# Patient Record
Sex: Male | Born: 1970 | Smoking: Former smoker
Health system: Southern US, Community
[De-identification: ages and names within clinical notes are randomized; demographics above are authoritative.]

## PROBLEM LIST (undated history)

## (undated) DIAGNOSIS — K635 Polyp of colon: Secondary | ICD-10-CM

## (undated) DIAGNOSIS — D860 Sarcoidosis of lung: Secondary | ICD-10-CM

## (undated) DIAGNOSIS — F419 Anxiety disorder, unspecified: Secondary | ICD-10-CM

## (undated) DIAGNOSIS — G4730 Sleep apnea, unspecified: Secondary | ICD-10-CM

## (undated) DIAGNOSIS — F1020 Alcohol dependence, uncomplicated: Secondary | ICD-10-CM

## (undated) DIAGNOSIS — J189 Pneumonia, unspecified organism: Secondary | ICD-10-CM

## (undated) DIAGNOSIS — D869 Sarcoidosis, unspecified: Secondary | ICD-10-CM

## (undated) DIAGNOSIS — J449 Chronic obstructive pulmonary disease, unspecified: Secondary | ICD-10-CM

## (undated) DIAGNOSIS — N200 Calculus of kidney: Secondary | ICD-10-CM

## (undated) DIAGNOSIS — E119 Type 2 diabetes mellitus without complications: Secondary | ICD-10-CM

## (undated) DIAGNOSIS — I4891 Unspecified atrial fibrillation: Secondary | ICD-10-CM

## (undated) DIAGNOSIS — J45909 Unspecified asthma, uncomplicated: Secondary | ICD-10-CM

## (undated) HISTORY — DX: Chronic obstructive pulmonary disease, unspecified: J44.9

## (undated) HISTORY — DX: Sarcoidosis, unspecified: D86.9

## (undated) HISTORY — DX: Calculus of kidney: N20.0

## (undated) HISTORY — PX: VASECTOMY: SHX75

## (undated) HISTORY — DX: Alcohol dependence, uncomplicated: F10.20

## (undated) HISTORY — DX: Anxiety disorder, unspecified: F41.9

## (undated) HISTORY — DX: Sleep apnea, unspecified: G47.30

## (undated) HISTORY — DX: Polyp of colon: K63.5

## (undated) HISTORY — PX: LUNG BIOPSY: SHX232

## (undated) HISTORY — DX: Pneumonia, unspecified organism: J18.9

---

## 2019-05-19 ENCOUNTER — Ambulatory Visit (INDEPENDENT_AMBULATORY_CARE_PROVIDER_SITE_OTHER): Payer: Non-veteran care | Admitting: Psychology

## 2019-05-19 DIAGNOSIS — F4310 Post-traumatic stress disorder, unspecified: Secondary | ICD-10-CM

## 2019-06-09 ENCOUNTER — Ambulatory Visit (INDEPENDENT_AMBULATORY_CARE_PROVIDER_SITE_OTHER): Payer: No Typology Code available for payment source | Admitting: Psychology

## 2019-06-09 DIAGNOSIS — F4310 Post-traumatic stress disorder, unspecified: Secondary | ICD-10-CM

## 2019-06-25 ENCOUNTER — Ambulatory Visit (INDEPENDENT_AMBULATORY_CARE_PROVIDER_SITE_OTHER): Payer: No Typology Code available for payment source | Admitting: Psychology

## 2019-06-25 DIAGNOSIS — F4310 Post-traumatic stress disorder, unspecified: Secondary | ICD-10-CM

## 2019-07-10 ENCOUNTER — Ambulatory Visit: Payer: Non-veteran care | Admitting: Psychology

## 2019-07-21 ENCOUNTER — Ambulatory Visit (INDEPENDENT_AMBULATORY_CARE_PROVIDER_SITE_OTHER): Payer: No Typology Code available for payment source | Admitting: Psychology

## 2019-07-21 DIAGNOSIS — F4310 Post-traumatic stress disorder, unspecified: Secondary | ICD-10-CM

## 2019-08-25 ENCOUNTER — Ambulatory Visit (INDEPENDENT_AMBULATORY_CARE_PROVIDER_SITE_OTHER): Payer: No Typology Code available for payment source | Admitting: Psychology

## 2019-08-25 DIAGNOSIS — F4310 Post-traumatic stress disorder, unspecified: Secondary | ICD-10-CM

## 2019-09-22 ENCOUNTER — Ambulatory Visit (INDEPENDENT_AMBULATORY_CARE_PROVIDER_SITE_OTHER): Payer: No Typology Code available for payment source | Admitting: Psychology

## 2019-09-22 DIAGNOSIS — F4310 Post-traumatic stress disorder, unspecified: Secondary | ICD-10-CM

## 2019-10-27 ENCOUNTER — Ambulatory Visit (INDEPENDENT_AMBULATORY_CARE_PROVIDER_SITE_OTHER): Payer: No Typology Code available for payment source | Admitting: Psychology

## 2019-10-27 DIAGNOSIS — F4310 Post-traumatic stress disorder, unspecified: Secondary | ICD-10-CM

## 2019-11-24 ENCOUNTER — Ambulatory Visit: Payer: No Typology Code available for payment source | Admitting: Psychology

## 2019-12-03 ENCOUNTER — Ambulatory Visit (INDEPENDENT_AMBULATORY_CARE_PROVIDER_SITE_OTHER): Payer: No Typology Code available for payment source | Admitting: Psychology

## 2019-12-03 DIAGNOSIS — F4310 Post-traumatic stress disorder, unspecified: Secondary | ICD-10-CM

## 2020-01-12 ENCOUNTER — Ambulatory Visit: Payer: No Typology Code available for payment source | Admitting: Psychology

## 2020-01-14 ENCOUNTER — Ambulatory Visit (INDEPENDENT_AMBULATORY_CARE_PROVIDER_SITE_OTHER): Payer: No Typology Code available for payment source | Admitting: Psychology

## 2020-01-14 DIAGNOSIS — F4310 Post-traumatic stress disorder, unspecified: Secondary | ICD-10-CM | POA: Diagnosis not present

## 2020-02-28 ENCOUNTER — Ambulatory Visit: Payer: Self-pay | Attending: Internal Medicine

## 2020-02-28 DIAGNOSIS — Z23 Encounter for immunization: Secondary | ICD-10-CM

## 2020-02-28 NOTE — Progress Notes (Signed)
   Covid-19 Vaccination Clinic  Name:  Jacob Gilmore    MRN: [id number] DOB: 1971/04/20  02/28/2020  [name] was observed post Covid-19 immunization for 15 minutes without incident. He was provided with Vaccine Information Sheet and instruction to access the V-Safe system.   [name] was instructed to call 911 with any severe reactions post vaccine: Marland Kitchen Difficulty breathing  . Swelling of face and throat  . A fast heartbeat  . A bad rash all over body  . Dizziness and weakness   Immunizations Administered    Name Date Dose VIS Date Route   Pfizer COVID-19 Vaccine 02/28/2020 10:30 AM 0.3 mL 12/10/2018 Intramuscular   Manufacturer: [employer], [employer]   Lot: [id number]   NDC: [address]-7

## 2020-03-18 ENCOUNTER — Ambulatory Visit (INDEPENDENT_AMBULATORY_CARE_PROVIDER_SITE_OTHER): Payer: No Typology Code available for payment source | Admitting: Psychology

## 2020-03-18 DIAGNOSIS — F4310 Post-traumatic stress disorder, unspecified: Secondary | ICD-10-CM | POA: Diagnosis not present

## 2020-04-26 ENCOUNTER — Ambulatory Visit: Payer: No Typology Code available for payment source | Admitting: Psychology

## 2020-10-07 ENCOUNTER — Encounter (HOSPITAL_COMMUNITY): Payer: Self-pay | Admitting: *Deleted

## 2020-10-07 NOTE — Progress Notes (Signed)
Received [facility] Author[facility] - [id number] referral from Dr. 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 Clinical review of pt follow up appt on 11/15 Pulmonary office note.  Pt with Covid Risk Score - 1. Pt appropriate for scheduling for Pulmonary rehab.  Will forward to support staff for scheduling and verification of insurance eligibility/benefits with pt consent. Alanson Aly, BSN Cardiac and Emergency planning/management officer

## 2020-10-11 ENCOUNTER — Telehealth (HOSPITAL_COMMUNITY): Payer: Self-pay | Admitting: General Practice

## 2020-11-02 ENCOUNTER — Telehealth (HOSPITAL_COMMUNITY): Payer: Self-pay | Admitting: *Deleted

## 2020-11-18 ENCOUNTER — Telehealth (HOSPITAL_COMMUNITY): Payer: Self-pay | Admitting: *Deleted

## 2020-11-22 ENCOUNTER — Other Ambulatory Visit: Payer: Self-pay

## 2020-11-22 ENCOUNTER — Encounter (HOSPITAL_COMMUNITY)
Admission: RE | Admit: 2020-11-22 | Discharge: 2020-11-22 | Disposition: A | Discharge status: Home / Self Care | Payer: No Typology Code available for payment source | Source: Ambulatory Visit | Attending: Cardiology | Admitting: Cardiology

## 2020-11-22 ENCOUNTER — Encounter (HOSPITAL_COMMUNITY): Payer: Self-pay

## 2020-11-22 VITALS — BP 104/60 | HR 106 | Ht 74.0 in | Wt 203.7 lb

## 2020-11-22 DIAGNOSIS — J439 Emphysema, unspecified: Secondary | ICD-10-CM | POA: Insufficient documentation

## 2020-11-22 DIAGNOSIS — Z87891 Personal history of nicotine dependence: Secondary | ICD-10-CM | POA: Insufficient documentation

## 2020-11-22 HISTORY — DX: Unspecified atrial fibrillation: I48.91

## 2020-11-22 HISTORY — DX: Sarcoidosis of lung: D86.0

## 2020-11-22 HISTORY — DX: Unspecified asthma, uncomplicated: J45.909

## 2020-11-22 HISTORY — DX: Type 2 diabetes mellitus without complications: E11.9

## 2020-11-22 NOTE — Progress Notes (Deleted)
Pulmonary Individual Treatment Plan  Patient Details  Name: Jacob Gilmore MRN: [id number] Date of Birth: 01/01/1971 Referring Provider:   Flowsheet Row Pulmonary Rehab Walk Test from 11/22/2020 in [facility]  [facility] Provider [facility]      Initial Encounter Date:  Flowsheet Row Pulmonary Rehab Walk Test from 11/22/2020 in [facility]  Date 11/22/20      Visit Diagnosis: Pulmonary emphysema, unspecified emphysema type (HCC)  Patient's Home Medications on Admission:   Current Outpatient Medications:  .  ARTIFICIAL SALIVA MT, TAKE 4 SPRAYS BY MOUTH AT BEDTIME AS NEEDED, Disp: , Rfl:  .  insulin aspart protamine - aspart (NOVOLOG MIX 70/30 FLEXPEN) (70-30) 100 UNIT/ML FlexPen, INJECT 30 UNITS SUBCUTANEOUSLY TWICE A DAY BEFORE MEALS, Disp: , Rfl:  .  mometasone (ASMANEX) 220 MCG/INH inhaler, INHALE 2 PUFFS BY MOUTH AT BEDTIME (RINSE MOUTH WELL WITH WATER AFTER EACH USE), Disp: , Rfl:   Past Medical History: Past Medical History:  Diagnosis Date  . Asthma   . Atrial fibrillation (HCC)   . Diabetes mellitus without complication (HCC)   . Pulmonary sarcoidosis (HCC)     Tobacco Use: Social History   Tobacco Use  Smoking Status Not on file  Smokeless Tobacco Not on file    Labs: Recent Review Flowsheet Data   There is no flowsheet data to display.     Capillary Blood Glucose: No results found for: GLUCAP   Pulmonary Assessment Scores:  Pulmonary Assessment Scores    Row Name 11/22/20 1001 11/22/20 1056       ADL UCSD   ADL Phase Entry --    SOB Score total 46 --         CAT Score   CAT Score 30 --         mMRC Score   mMRC Score -- 4          UCSD: Self-administered rating of dyspnea associated with activities of daily living (ADLs) 6-point scale (0 = "not at all" to 5 = "maximal or unable to do because of breathlessness")  Scoring Scores range from 0 to 120.  Minimally important difference is 5  units  CAT: CAT can identify the health impairment of COPD patients and is better correlated with disease progression.  CAT has a scoring range of zero to 40. The CAT score is classified into four groups of low (less than 10), medium (10 - 20), high (21-30) and very high (31-40) based on the impact level of disease on health status. A CAT score over 10 suggests significant symptoms.  A worsening CAT score could be explained by an exacerbation, poor medication adherence, poor inhaler technique, or progression of COPD or comorbid conditions.  CAT MCID is 2 points  mMRC: mMRC (Modified Medical Research Council) Dyspnea Scale is used to assess the degree of baseline functional disability in patients of respiratory disease due to dyspnea. No minimal important difference is established. A decrease in score of 1 point or greater is considered a positive change.   Pulmonary Function Assessment:  Pulmonary Function Assessment - 11/22/20 0958      Breath   Shortness of Breath Yes;Fear of Shortness of Breath;Limiting activity;Panic with Shortness of Breath           Exercise Target Goals: Exercise Program Goal: Individual exercise prescription set using results from initial 6 min walk test and THRR while considering  patient's activity barriers and safety.   Exercise Prescription Goal: Initial   exercise prescription builds to 30-45 minutes a day of aerobic activity, 2-3 days per week.  Home exercise guidelines will be given to patient during program as part of exercise prescription that the participant will acknowledge.  Activity Barriers & Risk Stratification:  Activity Barriers & Cardiac Risk Stratification - 11/22/20 0955      Activity Barriers & Cardiac Risk Stratification   Activity Barriers Deconditioning;Muscular Weakness;Shortness of Breath           6 Minute Walk:  6 Minute Walk    Row Name 11/22/20 1041         6 Minute Walk   Phase Initial     Distance 1016 feet     Walk  Time 6 minutes     # of Rest Breaks 0     MPH 1.92     METS 3.63     RPE 13     Perceived Dyspnea  2     VO2 Peak 12.69     Symptoms No     Resting HR 93 bpm     Resting BP 110/86     Resting Oxygen Saturation  98 %     Exercise Oxygen Saturation  during 6 min walk 86 %     Max Ex. HR 101 bpm     Max Ex. BP 120/90     2 Minute Post BP 108/88           Interval HR   1 Minute HR 101     2 Minute HR 93     3 Minute HR 91     4 Minute HR 94     5 Minute HR 95     6 Minute HR 100     2 Minute Post HR 90     Interval Heart Rate? Yes           Interval Oxygen   Interval Oxygen? Yes     Baseline Oxygen Saturation % 98 %     1 Minute Oxygen Saturation % 95 %     1 Minute Liters of Oxygen 0 L     2 Minute Oxygen Saturation % 95 %     2 Minute Liters of Oxygen 0 L     3 Minute Oxygen Saturation % 95 %     3 Minute Liters of Oxygen 0 L     4 Minute Oxygen Saturation % 93 %     4 Minute Liters of Oxygen 0 L     5 Minute Oxygen Saturation % 90 %     5 Minute Liters of Oxygen 0 L     6 Minute Oxygen Saturation % 86 %     6 Minute Liters of Oxygen 0 L     2 Minute Post Oxygen Saturation % 92 %     2 Minute Post Liters of Oxygen 0 L            Oxygen Initial Assessment:  Oxygen Initial Assessment - 11/22/20 1039      Home Oxygen   Home Oxygen Device None    Sleep Oxygen Prescription None    Home Exercise Oxygen Prescription None    Home Resting Oxygen Prescription None      Initial 6 min Walk   Oxygen Used None      Program Oxygen Prescription   Program Oxygen Prescription None      Intervention   Short Term Goals To learn and understand importance of monitoring   SPO2 with pulse oximeter and demonstrate accurate use of the pulse oximeter.;To learn and understand importance of maintaining oxygen saturations>88%;To learn and demonstrate proper pursed lip breathing techniques or other breathing techniques.;To learn and demonstrate proper use of respiratory medications     Long  Term Goals Verbalizes importance of monitoring SPO2 with pulse oximeter and return demonstration;Maintenance of O2 saturations>88%;Exhibits proper breathing techniques, such as pursed lip breathing or other method taught during program session;Compliance with respiratory medication;Demonstrates proper use of MDI's           Oxygen Re-Evaluation:   Oxygen Discharge (Final Oxygen Re-Evaluation):   Initial Exercise Prescription:  Initial Exercise Prescription - 11/22/20 1000      Date of Initial Exercise RX and Referring Provider   Date 11/22/20    [facility]    Expected Discharge Date 01/27/21      Treadmill   MPH 1.8    Grade 0    Minutes 15      NuStep   Level 2    SPM 80    Minutes 15      Prescription Details   Frequency (times per week) 2    Duration Progress to 45 minutes of aerobic exercise without signs/symptoms of physical distress      Intensity   THRR 40-80% of Max Heartrate 68-136    Ratings of Perceived Exertion 11-13    Perceived Dyspnea 0-4      Progression   Progression Continue to progress workloads to maintain intensity without signs/symptoms of physical distress.      Resistance Training   Training Prescription Yes    Weight blue bands    Reps 10-15           Perform Capillary Blood Glucose checks as needed.  Exercise Prescription Changes:   Exercise Comments:   Exercise Goals and Review:  Exercise Goals    Row Name 11/22/20 1037             Exercise Goals   Increase Physical Activity Yes       Intervention Provide advice, education, support and counseling about physical activity/exercise needs.;Develop an individualized exercise prescription for aerobic and resistive training based on initial evaluation findings, risk stratification, comorbidities and participant's personal goals.       Expected Outcomes Short Term: Attend rehab on a regular basis to increase amount of physical activity.;Long Term: Add in home  exercise to make exercise part of routine and to increase amount of physical activity.;Long Term: Exercising regularly at least 3-5 days a week.       Increase Strength and Stamina Yes       Intervention Provide advice, education, support and counseling about physical activity/exercise needs.;Develop an individualized exercise prescription for aerobic and resistive training based on initial evaluation findings, risk stratification, comorbidities and participant's personal goals.       Expected Outcomes Short Term: Increase workloads from initial exercise prescription for resistance, speed, and METs.;Short Term: Perform resistance training exercises routinely during rehab and add in resistance training at home;Long Term: Improve cardiorespiratory fitness, muscular endurance and strength as measured by increased METs and functional capacity (6MWT)       Able to understand and use rate of perceived exertion (RPE) scale Yes       Intervention Provide education and explanation on how to use RPE scale       Expected Outcomes Short Term: Able to use RPE daily in rehab to express subjective intensity level;Long Term:  Able to use   RPE to guide intensity level when exercising independently       Able to understand and use Dyspnea scale Yes       Intervention Provide education and explanation on how to use Dyspnea scale       Expected Outcomes Short Term: Able to use Dyspnea scale daily in rehab to express subjective sense of shortness of breath during exertion;Long Term: Able to use Dyspnea scale to guide intensity level when exercising independently       Knowledge and understanding of Target Heart Rate Range (THRR) Yes       Intervention Provide education and explanation of THRR including how the numbers were predicted and where they are located for reference       Expected Outcomes Short Term: Able to state/look up THRR;Long Term: Able to use THRR to govern intensity when exercising independently;Short Term:  Able to use daily as guideline for intensity in rehab       Understanding of Exercise Prescription Yes       Intervention Provide education, explanation, and written materials on patient's individual exercise prescription       Expected Outcomes Short Term: Able to explain program exercise prescription;Long Term: Able to explain home exercise prescription to exercise independently              Exercise Goals Re-Evaluation :   Discharge Exercise Prescription (Final Exercise Prescription Changes):   Nutrition:  Target Goals: Understanding of nutrition guidelines, daily intake of sodium <1500mg, cholesterol <200mg, calories 30% from fat and 7% or less from saturated fats, daily to have 5 or more servings of fruits and vegetables.  Biometrics:  Pre Biometrics - 11/22/20 0956      Pre Biometrics   Height 6' 2" (1.88 m)    Weight 92.4 kg    BMI (Calculated) 26.14    Grip Strength 45.5 kg            Nutrition Therapy Plan and Nutrition Goals:   Nutrition Assessments:  MEDIFICTS Score Key:  ?70 Need to make dietary changes   40-70 Heart Healthy Diet  ? 40 Therapeutic Level Cholesterol Diet   Picture Your Plate Scores:  <40 Unhealthy dietary pattern with much room for improvement.  41-50 Dietary pattern unlikely to meet recommendations for good health and room for improvement.  51-60 More healthful dietary pattern, with some room for improvement.   >60 Healthy dietary pattern, although there may be some specific behaviors that could be improved.    Nutrition Goals Re-Evaluation:   Nutrition Goals Discharge (Final Nutrition Goals Re-Evaluation):   Psychosocial: Target Goals: Acknowledge presence or absence of significant depression and/or stress, maximize coping skills, provide positive support system. Participant is able to verbalize types and ability to use techniques and skills needed for reducing stress and depression.  Initial Review & Psychosocial  Screening:  Initial Psych Review & Screening - 11/22/20 1001      Initial Review   Current issues with None Identified      Family Dynamics   Good Support System? Yes      Barriers   Psychosocial barriers to participate in program There are no identifiable barriers or psychosocial needs.      Screening Interventions   Interventions Encouraged to exercise           Quality of Life Scores:  Scores of 19 and below usually indicate a poorer quality of life in these areas.  A difference of  2-3 points is a clinically meaningful difference.    A difference of 2-3 points in the total score of the Quality of Life Index has been associated with significant improvement in overall quality of life, self-image, physical symptoms, and general health in studies assessing change in quality of life.  PHQ-9: Recent Review Flowsheet Data    Depression screen PHQ 2/9 11/22/2020   Decreased Interest 0   Down, Depressed, Hopeless 0   PHQ - 2 Score 0   Altered sleeping 0   Tired, decreased energy 2   Change in appetite 0   Feeling bad or failure about yourself  0   Trouble concentrating 0   Moving slowly or fidgety/restless 0   Suicidal thoughts 0   Difficult doing work/chores Not difficult at all     Interpretation of Total Score  Total Score Depression Severity:  1-4 = Minimal depression, 5-9 = Mild depression, 10-14 = Moderate depression, 15-19 = Moderately severe depression, 20-27 = Severe depression   Psychosocial Evaluation and Intervention:  Psychosocial Evaluation - 11/22/20 1002      Psychosocial Evaluation & Interventions   Interventions Encouraged to exercise with the program and follow exercise prescription    Comments No barriers identified    Continue Psychosocial Services  No Follow up required           Psychosocial Re-Evaluation:   Psychosocial Discharge (Final Psychosocial Re-Evaluation):   Education: Education Goals: Education classes will be provided on a weekly  basis, covering required topics. Participant will state understanding/return demonstration of topics presented.  Learning Barriers/Preferences:  Learning Barriers/Preferences - 11/22/20 1002      Learning Barriers/Preferences   Learning Barriers None    Learning Preferences Computer/Internet           Education Topics: Risk Factor Reduction:  -Group instruction that is supported by a PowerPoint presentation. Instructor discusses the definition of a risk factor, different risk factors for pulmonary disease, and how the heart and lungs work together.     Nutrition for Pulmonary Patient:  -Group instruction provided by PowerPoint slides, verbal discussion, and written materials to support subject matter. The instructor gives an explanation and review of healthy diet recommendations, which includes a discussion on weight management, recommendations for fruit and vegetable consumption, as well as protein, fluid, caffeine, fiber, sodium, sugar, and alcohol. Tips for eating when patients are short of breath are discussed.   Pursed Lip Breathing:  -Group instruction that is supported by demonstration and informational handouts. Instructor discusses the benefits of pursed lip and diaphragmatic breathing and detailed demonstration on how to preform both.     Oxygen Safety:  -Group instruction provided by PowerPoint, verbal discussion, and written material to support subject matter. There is an overview of "What is Oxygen" and "Why do we need it".  Instructor also reviews how to create a safe environment for oxygen use, the importance of using oxygen as prescribed, and the risks of noncompliance. There is a brief discussion on traveling with oxygen and resources the patient may utilize.   Oxygen Equipment:  -Group instruction provided by Home Health Staff utilizing handouts, written materials, and equipment demonstrations.   Signs and Symptoms:  -Group instruction provided by written material  and verbal discussion to support subject matter. Warning signs and symptoms of infection, stroke, and heart attack are reviewed and when to call the physician/911 reinforced. Tips for preventing the spread of infection discussed.   Advanced Directives:  -Group instruction provided by verbal instruction and written material to support subject matter. Instructor reviews Advanced Directive laws and   proper instruction for filling out document.   Pulmonary Video:  -Group video education that reviews the importance of medication and oxygen compliance, exercise, good nutrition, pulmonary hygiene, and pursed lip and diaphragmatic breathing for the pulmonary patient.   Exercise for the Pulmonary Patient:  -Group instruction that is supported by a PowerPoint presentation. Instructor discusses benefits of exercise, core components of exercise, frequency, duration, and intensity of an exercise routine, importance of utilizing pulse oximetry during exercise, safety while exercising, and options of places to exercise outside of rehab.     Pulmonary Medications:  -Verbally interactive group education provided by instructor with focus on inhaled medications and proper administration.   Anatomy and Physiology of the Respiratory System and Intimacy:  -Group instruction provided by PowerPoint, verbal discussion, and written material to support subject matter. Instructor reviews respiratory cycle and anatomical components of the respiratory system and their functions. Instructor also reviews differences in obstructive and restrictive respiratory diseases with examples of each. Intimacy, Sex, and Sexuality differences are reviewed with a discussion on how relationships can change when diagnosed with pulmonary disease. Common sexual concerns are reviewed.   MD DAY -A group question and answer session with a medical doctor that allows participants to ask questions that relate to their pulmonary disease  state.   OTHER EDUCATION -Group or individual verbal, written, or video instructions that support the educational goals of the pulmonary rehab program.   Holiday Eating Survival Tips:  -Group instruction provided by PowerPoint slides, verbal discussion, and written materials to support subject matter. The instructor gives patients tips, tricks, and techniques to help them not only survive but enjoy the holidays despite the onslaught of food that accompanies the holidays.   Knowledge Questionnaire Score:  Knowledge Questionnaire Score - 11/22/20 1019      Knowledge Questionnaire Score   Pre Score 14/18           Core Components/Risk Factors/Patient Goals at Admission:  Personal Goals and Risk Factors at Admission - 11/22/20 1003      Core Components/Risk Factors/Patient Goals on Admission   Improve shortness of breath with ADL's Yes    Intervention Provide education, individualized exercise plan and daily activity instruction to help decrease symptoms of SOB with activities of daily living.    Expected Outcomes Short Term: Improve cardiorespiratory fitness to achieve a reduction of symptoms when performing ADLs;Long Term: Be able to perform more ADLs without symptoms or delay the onset of symptoms           Core Components/Risk Factors/Patient Goals Review:   Goals and Risk Factor Review    Row Name 11/22/20 1003             Core Components/Risk Factors/Patient Goals Review   Personal Goals Review Develop more efficient breathing techniques such as purse lipped breathing and diaphragmatic breathing and practicing self-pacing with activity.;Increase knowledge of respiratory medications and ability to use respiratory devices properly.;Improve shortness of breath with ADL's              Core Components/Risk Factors/Patient Goals at Discharge (Final Review):   Goals and Risk Factor Review - 11/22/20 1003      Core Components/Risk Factors/Patient Goals Review   Personal  Goals Review Develop more efficient breathing techniques such as purse lipped breathing and diaphragmatic breathing and practicing self-pacing with activity.;Increase knowledge of respiratory medications and ability to use respiratory devices properly.;Improve shortness of breath with ADL's           ITP Comments:     Comments:  

## 2020-11-22 NOTE — Progress Notes (Signed)
Jacob Gilmore 50 y.o. male Pulmonary Rehab Orientation Note This patient who was referred to Pulmonary rehab by Dr. Shelle Iron from the [facility] in [place] with the diagnosis of emphysema, and he also has pulmonary sarcoidosis arrived today in Cardiac and Pulmonary Rehab. He arrived ambulatory with normal gait. He does not carry portable oxygen and does not use oxygen at all.  Color good, skin warm and dry. Patient is oriented to time and place. Patient's medical history, psychosocial health, and medications reviewed. Psychosocial assessment reveals pt lives alone. 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 Pt reports his stress level is low. Pt does not exhibit signs of depression. PHQ2/9 score 0/2. Pt shows good  coping skills with positive outlook .   Will continue to monitor and evaluate progress toward psychosocial goal(s) of continued mental well being while participating in pulmonary rehab. Physical assessment reveals heart rate is tachycardic, breath sounds clear to auscultation, no wheezes, rales, or rhonchi. Grip strength equal, strong. Patient reports he does take medications as prescribed. Patient states he follows a Diabetic diet. The patient reports no specific efforts to gain or lose weight.. Patient's weight will be monitored closely. Demonstration and practice of PLB using pulse oximeter. Patient able to return demonstration satisfactorily. Safety and hand hygiene in the exercise area reviewed with patient. Patient voices understanding of the information reviewed. Department expectations discussed with patient and achievable goals were set. The patient shows enthusiasm about attending the program and we look forward to working with this nice gentleman. The patient completed a 6 min walk test today 11/22/2020 and to begin exercise on Tuesday, 11/22/2020 in the 1: 30 pm exercise slot.  0900-1100

## 2020-11-22 NOTE — Progress Notes (Signed)
Pulmonary Individual Treatment Plan  Patient Details  Name: Jacob Gilmore MRN: [id number] Date of Birth: 18-Oct-1970 Referring Provider:   Doristine Devoid Pulmonary Rehab Walk Test from 11/22/2020 in [facility]  [facility]      Initial Encounter Date:  Flowsheet Row Pulmonary Rehab Walk Test from 11/22/2020 in [facility] CARDI[facility] REHAB  Date 11/22/20      Visit Diagnosis: Pulmonary emphysema, unspecified emphysema type (HCC)  Patient's Home Medications on Admission:   Current Outpatient Medications:  .  ARTIFICIAL SALIVA MT, TAKE 4 SPRAYS BY MOUTH AT BEDTIME AS NEEDED, Disp: , Rfl:  .  insulin aspart protamine - aspart (NOVOLOG MIX 70/30 FLEXPEN) (70-30) 100 UNIT/ML FlexPen, INJECT 30 UNITS SUBCUTANEOUSLY TWICE A DAY BEFORE MEALS, Disp: , Rfl:  .  mometasone (ASMANEX) 220 MCG/INH inhaler, INHALE 2 PUFFS BY MOUTH AT BEDTIME (RINSE MOUTH WELL WITH WATER AFTER EACH USE), Disp: , Rfl:   Past Medical History: Past Medical History:  Diagnosis Date  . Asthma   . Atrial fibrillation (HCC)   . Diabetes mellitus without complication (HCC)   . Pulmonary sarcoidosis (HCC)     Tobacco Use: Social History   Tobacco Use  Smoking Status Not on file  Smokeless Tobacco Not on file    Labs: Recent Review Flowsheet Data   There is no flowsheet data to display.     Capillary Blood Glucose: No results found for: GLUCAP   Pulmonary Assessment Scores:  Pulmonary Assessment Scores    Row Name 11/22/20 1001 11/22/20 1056       ADL UCSD   ADL Phase Entry --    SOB Score total 46 --         CAT Score   CAT Score 30 --         mMRC Score   mMRC Score -- 4          UCSD: Self-administered rating of dyspnea associated with activities of daily living (ADLs) 6-point scale (0 = "not at all" to 5 = "maximal or unable to do because of breathlessness")  Scoring Scores range from 0 to 120.  Minimally important difference is 5  units  CAT: CAT can identify the health impairment of COPD patients and is better correlated with disease progression.  CAT has a scoring range of zero to 40. The CAT score is classified into four groups of low (less than 10), medium (10 - 20), high (21-30) and very high (31-40) based on the impact level of disease on health status. A CAT score over 10 suggests significant symptoms.  A worsening CAT score could be explained by an exacerbation, poor medication adherence, poor inhaler technique, or progression of COPD or comorbid conditions.  CAT MCID is 2 points  mMRC: mMRC (Modified Medical Research Council) Dyspnea Scale is used to assess the degree of baseline functional disability in patients of respiratory disease due to dyspnea. No minimal important difference is established. A decrease in score of 1 point or greater is considered a positive change.   Pulmonary Function Assessment:  Pulmonary Function Assessment - 11/22/20 0958      Breath   Shortness of Breath Yes;Fear of Shortness of Breath;Limiting activity;Panic with Shortness of Breath           Exercise Target Goals: Exercise Program Goal: Individual exercise prescription set using results from initial 6 min walk test and THRR while considering  patient's activity barriers and safety.   Exercise Prescription Goal: Initial  exercise prescription builds to 30-45 minutes a day of aerobic activity, 2-3 days per week.  Home exercise guidelines will be given to patient during program as part of exercise prescription that the participant will acknowledge.  Activity Barriers & Risk Stratification:  Activity Barriers & Cardiac Risk Stratification - 11/22/20 0955      Activity Barriers & Cardiac Risk Stratification   Activity Barriers Deconditioning;Muscular Weakness;Shortness of Breath           6 Minute Walk:  6 Minute Walk    Row Name 11/22/20 1041         6 Minute Walk   Phase Initial     Distance 1016 feet     Walk  Time 6 minutes     # of Rest Breaks 0     MPH 1.92     METS 3.63     RPE 13     Perceived Dyspnea  2     VO2 Peak 12.69     Symptoms No     Resting HR 93 bpm     Resting BP 110/86     Resting Oxygen Saturation  98 %     Exercise Oxygen Saturation  during 6 min walk 86 %     Max Ex. HR 101 bpm     Max Ex. BP 120/90     2 Minute Post BP 108/88           Interval HR   1 Minute HR 101     2 Minute HR 93     3 Minute HR 91     4 Minute HR 94     5 Minute HR 95     6 Minute HR 100     2 Minute Post HR 90     Interval Heart Rate? Yes           Interval Oxygen   Interval Oxygen? Yes     Baseline Oxygen Saturation % 98 %     1 Minute Oxygen Saturation % 95 %     1 Minute Liters of Oxygen 0 L     2 Minute Oxygen Saturation % 95 %     2 Minute Liters of Oxygen 0 L     3 Minute Oxygen Saturation % 95 %     3 Minute Liters of Oxygen 0 L     4 Minute Oxygen Saturation % 93 %     4 Minute Liters of Oxygen 0 L     5 Minute Oxygen Saturation % 90 %     5 Minute Liters of Oxygen 0 L     6 Minute Oxygen Saturation % 86 %     6 Minute Liters of Oxygen 0 L     2 Minute Post Oxygen Saturation % 92 %     2 Minute Post Liters of Oxygen 0 L            Oxygen Initial Assessment:  Oxygen Initial Assessment - 11/22/20 1039      Home Oxygen   Home Oxygen Device None    Sleep Oxygen Prescription None    Home Exercise Oxygen Prescription None    Home Resting Oxygen Prescription None      Initial 6 min Walk   Oxygen Used None      Program Oxygen Prescription   Program Oxygen Prescription None      Intervention   Short Term Goals To learn and understand importance of monitoring  SPO2 with pulse oximeter and demonstrate accurate use of the pulse oximeter.;To learn and understand importance of maintaining oxygen saturations>88%;To learn and demonstrate proper pursed lip breathing techniques or other breathing techniques.;To learn and demonstrate proper use of respiratory medications     Long  Term Goals Verbalizes importance of monitoring SPO2 with pulse oximeter and return demonstration;Maintenance of O2 saturations>88%;Exhibits proper breathing techniques, such as pursed lip breathing or other method taught during program session;Compliance with respiratory medication;Demonstrates proper use of MDI's           Oxygen Re-Evaluation:   Oxygen Discharge (Final Oxygen Re-Evaluation):   Initial Exercise Prescription:  Initial Exercise Prescription - 11/22/20 1000      Date of Initial Exercise RX and Referring Provider   Date 11/22/20    [facility]    Expected Discharge Date 01/27/21      Treadmill   MPH 1.8    Grade 0    Minutes 15      NuStep   Level 2    SPM 80    Minutes 15      Prescription Details   Frequency (times per week) 2    Duration Progress to 45 minutes of aerobic exercise without signs/symptoms of physical distress      Intensity   THRR 40-80% of Max Heartrate 68-136    Ratings of Perceived Exertion 11-13    Perceived Dyspnea 0-4      Progression   Progression Continue to progress workloads to maintain intensity without signs/symptoms of physical distress.      Resistance Training   Training Prescription Yes    Weight blue bands    Reps 10-15           Perform Capillary Blood Glucose checks as needed.  Exercise Prescription Changes:   Exercise Comments:   Exercise Goals and Review:  Exercise Goals    Row Name 11/22/20 1037             Exercise Goals   Increase Physical Activity Yes       Intervention Provide advice, education, support and counseling about physical activity/exercise needs.;Develop an individualized exercise prescription for aerobic and resistive training based on initial evaluation findings, risk stratification, comorbidities and participant's personal goals.       Expected Outcomes Short Term: Attend rehab on a regular basis to increase amount of physical activity.;Long Term: Add in home  exercise to make exercise part of routine and to increase amount of physical activity.;Long Term: Exercising regularly at least 3-5 days a week.       Increase Strength and Stamina Yes       Intervention Provide advice, education, support and counseling about physical activity/exercise needs.;Develop an individualized exercise prescription for aerobic and resistive training based on initial evaluation findings, risk stratification, comorbidities and participant's personal goals.       Expected Outcomes Short Term: Increase workloads from initial exercise prescription for resistance, speed, and METs.;Short Term: Perform resistance training exercises routinely during rehab and add in resistance training at home;Long Term: Improve cardiorespiratory fitness, muscular endurance and strength as measured by increased METs and functional capacity ( )       Able to understand and use rate of perceived exertion (RPE) scale Yes       Intervention Provide education and explanation on how to use RPE scale       Expected Outcomes Short Term: Able to use RPE daily in rehab to express subjective intensity level;Long Term:  Able to use  RPE to guide intensity level when exercising independently       Able to understand and use Dyspnea scale Yes       Intervention Provide education and explanation on how to use Dyspnea scale       Expected Outcomes Short Term: Able to use Dyspnea scale daily in rehab to express subjective sense of shortness of breath during exertion;Long Term: Able to use Dyspnea scale to guide intensity level when exercising independently       Knowledge and understanding of Target Heart Rate Range (THRR) Yes       Intervention Provide education and explanation of THRR including how the numbers were predicted and where they are located for reference       Expected Outcomes Short Term: Able to state/look up THRR;Long Term: Able to use THRR to govern intensity when exercising independently;Short Term:  Able to use daily as guideline for intensity in rehab       Understanding of Exercise Prescription Yes       Intervention Provide education, explanation, and written materials on patient's individual exercise prescription       Expected Outcomes Short Term: Able to explain program exercise prescription;Long Term: Able to explain home exercise prescription to exercise independently              Exercise Goals Re-Evaluation :   Discharge Exercise Prescription (Final Exercise Prescription Changes):   Nutrition:  Target Goals: Understanding of nutrition guidelines, daily intake of sodium 1500mg , cholesterol 200mg , calories 30% from fat and 7% or less from saturated fats, daily to have 5 or more servings of fruits and vegetables.  Biometrics:  Pre Biometrics - 11/22/20 0956      Pre Biometrics   Height 6\' 2"  (1.88 m)    Weight 92.4 kg    BMI (Calculated) 26.14    Grip Strength 45.5 kg            Nutrition Therapy Plan and Nutrition Goals:   Nutrition Assessments:  MEDIFICTS Score Key:  ?70 Need to make dietary changes   40-70 Heart Healthy Diet  ? 40 Therapeutic Level Cholesterol Diet   Picture Your Plate Scores:  Unhealthy dietary pattern with much room for improvement.  41-50 Dietary pattern unlikely to meet recommendations for good health and room for improvement.  51-60 More healthful dietary pattern, with some room for improvement.   >60 Healthy dietary pattern, although there may be some specific behaviors that could be improved.    Nutrition Goals Re-Evaluation:   Nutrition Goals Discharge (Final Nutrition Goals Re-Evaluation):   Psychosocial: Target Goals: Acknowledge presence or absence of significant depression and/or stress, maximize coping skills, provide positive support system. Participant is able to verbalize types and ability to use techniques and skills needed for reducing stress and depression.  Initial Review & Psychosocial  Screening:  Initial Psych Review & Screening - 11/22/20 1001      Initial Review   Current issues with None Identified      Family Dynamics   Good Support System? Yes      Barriers   Psychosocial barriers to participate in program There are no identifiable barriers or psychosocial needs.      Screening Interventions   Interventions Encouraged to exercise           Quality of Life Scores:  Scores of 19 and below usually indicate a poorer quality of life in these areas.  A difference of  2-3 points is a clinically meaningful difference.  A difference of 2-3 points in the total score of the Quality of Life Index has been associated with significant improvement in overall quality of life, self-image, physical symptoms, and general health in studies assessing change in quality of life.  PHQ-9: Recent Review Flowsheet Data    Depression screen [facility] 2/9 11/22/2020   Decreased Interest 0   Down, Depressed, Hopeless 0   PHQ - 2 Score 0   Altered sleeping 0   Tired, decreased energy 2   Change in appetite 0   Feeling bad or failure about yourself  0   Trouble concentrating 0   Moving slowly or fidgety/restless 0   Suicidal thoughts 0   Difficult doing work/chores Not difficult at all     Interpretation of Total Score  Total Score Depression Severity:  1-4 = Minimal depression, 5-9 = Mild depression, 10-14 = Moderate depression, 15-19 = Moderately severe depression, 20-27 = Severe depression   Psychosocial Evaluation and Intervention:  Psychosocial Evaluation - 11/22/20 1002      Psychosocial Evaluation & Interventions   Interventions Encouraged to exercise with the program and follow exercise prescription    Comments No barriers identified    Continue Psychosocial Services  No Follow up required           Psychosocial Re-Evaluation:   Psychosocial Discharge (Final Psychosocial Re-Evaluation):   Education: Education Goals: Education classes will be provided on a weekly  basis, covering required topics. Participant will state understanding/return demonstration of topics presented.  Learning Barriers/Preferences:  Learning Barriers/Preferences - 11/22/20 1002      Learning Barriers/Preferences   Learning Barriers None    Learning Preferences Computer/Internet           Education Topics: Risk Factor Reduction:  -Group instruction that is supported by a PowerPoint presentation. Instructor discusses the definition of a risk factor, different risk factors for pulmonary disease, and how the heart and lungs work together.     Nutrition for Pulmonary Patient:  -Group instruction provided by PowerPoint slides, verbal discussion, and written materials to support subject matter. The instructor gives an explanation and review of healthy diet recommendations, which includes a discussion on weight management, recommendations for fruit and vegetable consumption, as well as protein, fluid, caffeine, fiber, sodium, sugar, and alcohol. Tips for eating when patients are short of breath are discussed.   Pursed Lip Breathing:  -Group instruction that is supported by demonstration and informational handouts. Instructor discusses the benefits of pursed lip and diaphragmatic breathing and detailed demonstration on how to preform both.     Oxygen Safety:  -Group instruction provided by PowerPoint, verbal discussion, and written material to support subject matter. There is an overview of "What is Oxygen" and "Why do we need it".  Instructor also reviews how to create a safe environment for oxygen use, the importance of using oxygen as prescribed, and the risks of noncompliance. There is a brief discussion on traveling with oxygen and resources the patient may utilize.   Oxygen Equipment:  -Group instruction provided by [facility] Staff utilizing handouts, written materials, and equipment demonstrations.   Signs and Symptoms:  -Group instruction provided by written material  and verbal discussion to support subject matter. Warning signs and symptoms of infection, stroke, and heart attack are reviewed and when to call the physician/911 reinforced. Tips for preventing the spread of infection discussed.   Advanced Directives:  -Group instruction provided by verbal instruction and written material to support subject matter. Instructor reviews Energy manager and  proper instruction for filling out document.   Pulmonary Video:  -Group video education that reviews the importance of medication and oxygen compliance, exercise, good nutrition, pulmonary hygiene, and pursed lip and diaphragmatic breathing for the pulmonary patient.   Exercise for the Pulmonary Patient:  -Group instruction that is supported by a PowerPoint presentation. Instructor discusses benefits of exercise, core components of exercise, frequency, duration, and intensity of an exercise routine, importance of utilizing pulse oximetry during exercise, safety while exercising, and options of places to exercise outside of rehab.     Pulmonary Medications:  -Verbally interactive group education provided by instructor with focus on inhaled medications and proper administration.   Anatomy and Physiology of the Respiratory System and Intimacy:  -Group instruction provided by PowerPoint, verbal discussion, and written material to support subject matter. Instructor reviews respiratory cycle and anatomical components of the respiratory system and their functions. Instructor also reviews differences in obstructive and restrictive respiratory diseases with examples of each. Intimacy, Sex, and Sexuality differences are reviewed with a discussion on how relationships can change when diagnosed with pulmonary disease. Common sexual concerns are reviewed.   MD DAY -A group question and answer session with a medical doctor that allows participants to ask questions that relate to their pulmonary disease  state.   OTHER EDUCATION -Group or individual verbal, written, or video instructions that support the educational goals of the pulmonary rehab program.   Holiday Eating Survival Tips:  -Group instruction provided by PowerPoint slides, verbal discussion, and written materials to support subject matter. The instructor gives patients tips, tricks, and techniques to help them not only survive but enjoy the holidays despite the onslaught of food that accompanies the holidays.   Knowledge Questionnaire Score:  Knowledge Questionnaire Score - 11/22/20 1019      Knowledge Questionnaire Score   Pre Score 14/18           Core Components/Risk Factors/Patient Goals at Admission:  Personal Goals and Risk Factors at Admission - 11/22/20 1003      Core Components/Risk Factors/Patient Goals on Admission   Improve shortness of breath with ADL's Yes    Intervention Provide education, individualized exercise plan and daily activity instruction to help decrease symptoms of SOB with activities of daily living.    Expected Outcomes Short Term: Improve cardiorespiratory fitness to achieve a reduction of symptoms when performing ADLs;Long Term: Be able to perform more ADLs without symptoms or delay the onset of symptoms           Core Components/Risk Factors/Patient Goals Review:   Goals and Risk Factor Review    Row Name 11/22/20 1003             Core Components/Risk Factors/Patient Goals Review   Personal Goals Review Develop more efficient breathing techniques such as purse lipped breathing and diaphragmatic breathing and practicing self-pacing with activity.;Increase knowledge of respiratory medications and ability to use respiratory devices properly.;Improve shortness of breath with ADL's              Core Components/Risk Factors/Patient Goals at Discharge (Final Review):   Goals and Risk Factor Review - 11/22/20 1003      Core Components/Risk Factors/Patient Goals Review   Personal  Goals Review Develop more efficient breathing techniques such as purse lipped breathing and diaphragmatic breathing and practicing self-pacing with activity.;Increase knowledge of respiratory medications and ability to use respiratory devices properly.;Improve shortness of breath with ADL's           ITP Comments:  Comments:

## 2020-11-30 ENCOUNTER — Other Ambulatory Visit: Payer: Self-pay

## 2020-11-30 ENCOUNTER — Encounter (HOSPITAL_COMMUNITY)
Admission: RE | Admit: 2020-11-30 | Discharge: 2020-11-30 | Disposition: A | Discharge status: Home / Self Care | Payer: No Typology Code available for payment source | Source: Ambulatory Visit | Attending: Cardiology | Admitting: Cardiology

## 2020-11-30 DIAGNOSIS — J439 Emphysema, unspecified: Secondary | ICD-10-CM | POA: Diagnosis not present

## 2020-11-30 LAB — GLUCOSE, CAPILLARY: Glucose-Capillary: 186 mg/dL — ABNORMAL HIGH (ref 70–99)

## 2020-11-30 NOTE — Progress Notes (Signed)
Daily Session Note  Patient Details  Name: Jacob Gilmore MRN: [id number] Date of Birth: [birth year] Referring Provider:   April Manson Pulmonary Rehab Walk Test from 11/22/2020 in [place]  [facility]      Encounter Date: 11/30/2020  Check In:  Session Check In - 11/30/20 1436      Check-In   Supervising physician immediately available to respond to emergencies [facility] immediately available    Physician(s) Dr. Tana Coast    Location [facility]    Staff Present Rosebud Poles, RN, Isaac Laud, MS, ACSM-CEP, Exercise Physiologist;Lisa Ysidro Evert, RN    Virtual Visit No    Medication changes reported     No    Fall or balance concerns reported    No    Tobacco Cessation No Change    Warm-up and Cool-down Performed on first and last piece of equipment    Resistance Training Performed Yes    VAD Patient? No    PAD/SET Patient? No      Pain Assessment   Currently in Pain? No/denies    Multiple Pain Sites No           Capillary Blood Glucose: Results for orders placed or performed during the hospital encounter of 11/30/20 (from the past 24 hour(s))  Glucose, capillary     Status: Abnormal   Collection Time: 11/30/20  2:39 PM  Result Value Ref Range   Glucose-Capillary 186 (H) 70 - 99 mg/dL      Social History   Tobacco Use  Smoking Status Not on file  Smokeless Tobacco Not on file    Goals Met:  Proper associated with RPD/PD & O2 Sat Exercise tolerated well No report of cardiac concerns or symptoms Strength training completed today  Goals Unmet:  Not Applicable  Comments: Service time is from 1320 to 1448 Patient completed first day of exercise and tolerated well with no complaints or concerns.     Dr. Fransico Him is Medical Director for Cardiac Rehab at [facility].

## 2020-12-01 ENCOUNTER — Encounter: Payer: Self-pay | Admitting: Gastroenterology

## 2020-12-01 ENCOUNTER — Telehealth: Payer: Self-pay | Admitting: Gastroenterology

## 2020-12-01 ENCOUNTER — Ambulatory Visit (INDEPENDENT_AMBULATORY_CARE_PROVIDER_SITE_OTHER): Payer: No Typology Code available for payment source | Admitting: Gastroenterology

## 2020-12-01 ENCOUNTER — Other Ambulatory Visit (INDEPENDENT_AMBULATORY_CARE_PROVIDER_SITE_OTHER): Payer: No Typology Code available for payment source

## 2020-12-01 VITALS — BP 114/80 | HR 92 | Ht 72.25 in | Wt 207.0 lb

## 2020-12-01 DIAGNOSIS — R945 Abnormal results of liver function studies: Secondary | ICD-10-CM

## 2020-12-01 DIAGNOSIS — R7989 Other specified abnormal findings of blood chemistry: Secondary | ICD-10-CM

## 2020-12-01 LAB — COMPREHENSIVE METABOLIC PANEL
ALT: 25 U/L (ref 0–53)
AST: 13 U/L (ref 0–37)
Albumin: 4.1 g/dL (ref 3.5–5.2)
Alkaline Phosphatase: 63 U/L (ref 39–117)
BUN: 11 mg/dL (ref 6–23)
CO2: 30 mEq/L (ref 19–32)
Calcium: 9.5 mg/dL (ref 8.4–10.5)
Chloride: 100 mEq/L (ref 96–112)
Creatinine, Ser: 1.34 mg/dL (ref 0.40–1.50)
GFR: 61.89 mL/min (ref >60.00)
Glucose, Bld: 243 mg/dL — ABNORMAL HIGH (ref 70–99)
Potassium: 4.2 mEq/L (ref 3.5–5.1)
Sodium: 138 mEq/L (ref 135–145)
Total Bilirubin: 0.3 mg/dL (ref 0.2–1.2)
Total Protein: 7.1 g/dL (ref 6.0–8.3)

## 2020-12-01 LAB — PROTIME-INR
INR: 1.1 ratio — ABNORMAL HIGH (ref 0.8–1.0)
Prothrombin Time: 12.1 s (ref 9.6–13.1)

## 2020-12-01 LAB — CBC
HCT: 45.6 % (ref 39.0–52.0)
Hemoglobin: 15.1 g/dL (ref 13.0–17.0)
MCHC: 33.2 g/dL (ref 30.0–36.0)
MCV: 79.9 fl (ref 78.0–100.0)
Platelets: 247 10*3/uL (ref 150.0–400.0)
RBC: 5.7 Mil/uL (ref 4.22–5.81)
RDW: 14.6 % (ref 11.5–15.5)
WBC: 5 10*3/uL (ref 4.0–10.5)

## 2020-12-01 NOTE — Telephone Encounter (Signed)
Received [facility] from [facility].  Put on Dr Christella Hartigan desk for his review.

## 2020-12-01 NOTE — Patient Instructions (Signed)
If you are age 51 or younger, your body mass index should be between 19-25. Your Body mass index is 27.88 kg/m. If this is out of the aformentioned range listed, please consider follow up with your Primary Care Provider.   Your provider has requested that you go to the basement level for lab work before leaving today. Press "B" on the elevator. The lab is located at the first door on the left as you exit the elevator.  Due to recent changes in healthcare laws, you may see the results of your imaging and laboratory studies on MyChart before your provider has had a chance to review them.  We understand that in some cases there may be results that are confusing or concerning to you. Not all laboratory results come back in the same time frame and the provider may be waiting for multiple results in order to interpret others.  Please give [place] 48 hours in order for your provider to thoroughly review all the results before contacting the office for clarification of your results.   [facility] that [facility].  Thank you for entrusting me with your care and choosing [facility].  Dr Christella Hartigan

## 2020-12-01 NOTE — Progress Notes (Signed)
HPI: This is a very pleasant 50 year old man who was referred to me by [facility].  He was told to follow-up with a gastroenterologist after he had a CT scan of his chest through the [place] system recently.  He was diagnosed with sarcoidosis around 2000. As far as he knows it is only involve his chest. He has intermittently been on prednisone over the years. He believes the sarcoidosis was proven by biopsy at one point.  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 At one point he was told that he had atrial fibrillation because of his over drinking. He has never had jaundice or hepatitis that he is aware of, never had pancreatitis that he is aware of. Liver disease does not run in his family.  His weight is overall stable, he has no significant abdominal pains.  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 He recently received a promotion and he is going to be relocating to [place].  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 He took part in [facility].  Old Data Reviewed:  I reviewed a 12 page packet from a local [place]. there was very little clinical information in the packet.  It looks like he was referred by Barbee Shropshire but it is hard to be certain about that.  The reason for his referral was "patient had a CT scan, and an incidental finding was abnormal imaging of the liver.  Can you please review this, and advise as to potential next step in evaluation of possible hepatic sarcoid?  Thank you."    The CT scan report was not included in the referring packet.  There was a single set of labs that were sent however it looks like they were drawn February 2021.  These were completely normal liver tests but alk phos was not sent.   Review of systems: Pertinent positive and negative review of systems were noted in the above HPI section. All other review  negative.   Past Medical History:  Diagnosis Date  . Alcoholism ([place])   . Anxiety   . Asthma   . Atrial fibrillation ([place])   . Colon polyps   . COPD (chronic obstructive pulmonary disease) ([place])   . Diabetes mellitus without complication ([place])   . Kidney stone   . Pneumonia   . Pulmonary sarcoidosis ([place])   . Sleep apnea    CPAP    Past Surgical History:  Procedure Laterality Date  . LUNG BIOPSY    . VASECTOMY      Current Outpatient Medications  Medication Sig Dispense Refill  . albuterol (VENTOLIN HFA) 108 (90 Base) MCG/ACT inhaler Inhale 2 puffs into the lungs as needed.    . ARTIFICIAL SALIVA MT Take 4 sprays by mouth at bedtime as needed.    . diphenhydrAMINE (BENADRYL) 25 mg capsule Take 25 mg by mouth 4 (four) times daily as needed.    . doxepin (SINEQUAN) 10 MG capsule Take 10 mg by mouth at bedtime.    . insulin aspart protamine - aspart (NOVOLOG MIX 70/30 FLEXPEN) (70-30) 100 UNIT/ML FlexPen Inject 30 Units into the skin 2 (two) times daily before a meal.    . mometasone (ASMANEX) 220 MCG/INH inhaler Inhale 2 puffs into the lungs at bedtime.    . Semaglutide,0.25 or 0.5MG/DOS, 2 MG/1.5ML SOPN Inject 0.5 mg into the  skin once a week.    . sildenafil (VIAGRA) 100 MG tablet Take 50 mg by mouth as needed.    . tamsulosin (FLOMAX) 0.4 MG CAPS capsule Take 1 capsule by mouth at bedtime.    . Tiotropium Bromide-Olodaterol 2.5-2.5 MCG/ACT AERS INHALE 2 PUFFS BY MOUTH IN THE MORNING    . trospium (SANCTURA) 20 MG tablet Take 20 mg by mouth 2 (two) times daily. Do not take with oxybutynin     No current facility-administered medications for this visit.    Allergies as of 12/01/2020 - Review Complete 12/01/2020  Allergen Reaction Noted  . Metformin Diarrhea 01/02/2019    No family history on file.  Social History   Socioeconomic History  . Marital status: Unknown    Spouse name: Not on file  . Number of children: Not on file  . Years of education: Not on file  .  Highest education level: Not on file  Occupational History  . Not on file  Tobacco Use  . Smoking status: Not on file  . Smokeless tobacco: Not on file  Substance and Sexual Activity  . Alcohol use: Not on file  . Drug use: Not on file  . Sexual activity: Not on file  Other Topics Concern  . Not on file  Social History Narrative  . Not on file   Social Determinants of Health   Financial Resource Strain: Not on file  Food Insecurity: Not on file  Transportation Needs: Not on file  Physical Activity: Not on file  Stress: Not on file  Social Connections: Not on file  Intimate Partner Violence: Not on file     Physical Exam: Ht 6' 0.25" (1.835 m) Comment: height measured without shoes  Wt 207 lb (93.9 kg)   BMI 27.88 kg/m  Constitutional: generally well-appearing Psychiatric: alert and oriented x3 Eyes: extraocular movements intact Mouth: oral pharynx moist, no lesions Neck: supple no lymphadenopathy Cardiovascular: heart regular rate and rhythm Lungs: clear to auscultation bilaterally Abdomen: soft, nontender, nondistended, no obvious ascites, no peritoneal signs, normal bowel sounds Extremities: no lower extremity edema bilaterally Skin: no lesions on visible extremities   Assessment and plan: 50 y.o. male with "and incidental finding was abnormal imaging of the liver"  I am not sure exactly what the statement above means from his referring [place] provider but that is why they sent him here.  He was sent here by the [facility] system for something abnormal on a CT scan of his liver.  The CT scan report was not available.  Liver testing was available however it is from at least a year ago.  His LFTs were all normal.  I apologized to him for not having adequate data from his referring providers at the time of his visit. We do not have his CT scan. He tells me it was up the chest. We do not have any up-to-date blood work of his liver either.  We had him sign a release to get his  CT scan report sent here for my review. He will get some blood work in the meantime including complete metabolic profile, coags, CBC.  Please see the "Patient Instructions" section for addition details about the plan.   Owens Loffler, MD [facility] 12/01/2020, 10:18 AM  Total time on date of encounter was 45  minutes (this included time spent preparing to see the patient reviewing records; obtaining and/or reviewing separately obtained history; performing a medically appropriate exam and/or evaluation; counseling and educating the patient and  family if present; ordering medications, tests or procedures if applicable; and documenting clinical information in the health record).

## 2020-12-01 NOTE — Telephone Encounter (Signed)
I reviewed a CT chest without contrast dated December 2021 done in the [place] system.  Of importance for this office "areas of hypoattenuation arising within the hepatic parenchyma, the largest measuring 3 cm, of indeterminate significance.  On the 2001 outside CT there were numerous areas of hypoattenuation within the liver; given the reported history of sarcoid 1 would query whether the findings at that time and on the current examination represent hepatic sarcoidosis.  The most useful next step would be to acquire any available outside prior imaging that either targeted the abdomen and pelvis were made have included it in the field of imaging (such as outside CTs of the thorax).  1 could also consider hepatic MRI for further characterization as the findings are nonspecific, and the largest 3 cm area of hypoattenuation in the subcapsular right hepatic lobe is clearly not seen on limited review of available prior imaging."   Patty, Please let him know that his liver tests and other labs were all essentially normal and that I was able to review his chest CT from the [place] system.  He needs a liver MRI for abnormal outside CT scan, possible hepatic sarcoidosis.

## 2020-12-01 NOTE — Telephone Encounter (Signed)
Kent from [place] is requesting a call back to advise how should he send the CT scan report via fax or a disc.   Caller states he will fax over the report in the meantime, please call if a disc of the images is needed as well.  CB [phone]

## 2020-12-02 ENCOUNTER — Encounter (HOSPITAL_COMMUNITY): Payer: No Typology Code available for payment source

## 2020-12-02 ENCOUNTER — Telehealth (HOSPITAL_COMMUNITY): Payer: Self-pay | Admitting: General Practice

## 2020-12-02 NOTE — Telephone Encounter (Signed)
Left message on machine to call back  

## 2020-12-03 ENCOUNTER — Other Ambulatory Visit: Payer: Self-pay

## 2020-12-03 DIAGNOSIS — R945 Abnormal results of liver function studies: Secondary | ICD-10-CM

## 2020-12-03 DIAGNOSIS — R7989 Other specified abnormal findings of blood chemistry: Secondary | ICD-10-CM

## 2020-12-03 DIAGNOSIS — R932 Abnormal findings on diagnostic imaging of liver and biliary tract: Secondary | ICD-10-CM

## 2020-12-03 LAB — ANGIOTENSIN CONVERTING ENZYME: Angiotensin-Converting Enzyme: 19 U/L (ref 9–67)

## 2020-12-03 NOTE — Telephone Encounter (Signed)
Left message on machine to call back  

## 2020-12-03 NOTE — Telephone Encounter (Signed)
See results note 2/18

## 2020-12-07 ENCOUNTER — Other Ambulatory Visit: Payer: Self-pay

## 2020-12-07 ENCOUNTER — Encounter (HOSPITAL_COMMUNITY)
Admission: RE | Admit: 2020-12-07 | Discharge: 2020-12-07 | Disposition: A | Discharge status: Home / Self Care | Payer: No Typology Code available for payment source | Source: Ambulatory Visit | Attending: Cardiology | Admitting: Cardiology

## 2020-12-07 VITALS — Wt 206.1 lb

## 2020-12-07 DIAGNOSIS — J439 Emphysema, unspecified: Secondary | ICD-10-CM

## 2020-12-07 NOTE — Progress Notes (Signed)
Daily Session Note  Patient Details  Name: Jacob Gilmore MRN: [id number] Date of Birth: 14-Feb-1971 Referring Provider:   April Manson Pulmonary Rehab Walk Test from 11/22/2020 in [place]  [facility]      Encounter Date: 12/07/2020  Check In:  Session Check In - 12/07/20 1452      Check-In   Supervising physician immediately available to respond to emergencies [facility] immediately available    Physician(s) Dr. Sloan Leiter    Location [facility]    Staff Present Rosebud Poles, RN, Isaac Laud, MS, ACSM-CEP, Exercise Physiologist;Soraya Paquette Ysidro Evert, RN    Virtual Visit No    Medication changes reported     No    Fall or balance concerns reported    No    Tobacco Cessation No Change    Warm-up and Cool-down Performed on first and last piece of equipment    Resistance Training Performed Yes    VAD Patient? No    PAD/SET Patient? No      Pain Assessment   Currently in Pain? No/denies    Multiple Pain Sites No           Capillary Blood Glucose: No results found for this or any previous visit (from the past 24 hour(s)).  POCT Glucose - 12/07/20 1507      POCT Blood Glucose   Pre-Exercise 186 mg/dL    Post-Exercise 151 mg/dL           Exercise Prescription Changes - 12/07/20 1500      Response to Exercise   Blood Pressure (Admit) 128/87    Blood Pressure (Exercise) 150/80    Blood Pressure (Exit) 118/86    Heart Rate (Admit) 100 bpm    Heart Rate (Exercise) 113 bpm    Heart Rate (Exit) 95 bpm    Oxygen Saturation (Admit) 99 %    Oxygen Saturation (Exercise) 93 %    Oxygen Saturation (Exit) 98 %    Rating of Perceived Exertion (Exercise) 15    Perceived Dyspnea (Exercise) 2.5    Duration Continue with 30 min of aerobic exercise without signs/symptoms of physical distress.    Intensity THRR unchanged      Progression   Progression Continue to progress workloads to maintain intensity without  signs/symptoms of physical distress.      Resistance Training   Training Prescription Yes    Weight blue bands    Reps 10-15    Time 10 Minutes      NuStep   Level 2    SPM 80    Minutes 15    METs 1.9           Social History   Tobacco Use  Smoking Status Former Smoker  . Types: Cigarettes  . Quit date: 12/01/2013  . Years since quitting: 7.0  Smokeless Tobacco Never Used    Goals Met:  Exercise tolerated well No report of cardiac concerns or symptoms Strength training completed today  Goals Unmet:  Not Applicable  Comments: Service time is from 1310 to 1425    Dr. Fransico Him is Medical Director for Cardiac Rehab at [facility].

## 2020-12-09 ENCOUNTER — Encounter (HOSPITAL_COMMUNITY): Payer: No Typology Code available for payment source

## 2020-12-14 ENCOUNTER — Ambulatory Visit (HOSPITAL_COMMUNITY)
Admission: RE | Admit: 2020-12-14 | Discharge: 2020-12-14 | Disposition: A | Discharge status: Home / Self Care | Payer: No Typology Code available for payment source | Source: Ambulatory Visit | Attending: Gastroenterology | Admitting: Gastroenterology

## 2020-12-14 ENCOUNTER — Other Ambulatory Visit: Payer: Self-pay

## 2020-12-14 ENCOUNTER — Encounter (HOSPITAL_COMMUNITY)
Admission: RE | Admit: 2020-12-14 | Discharge: 2020-12-14 | Disposition: A | Discharge status: Home / Self Care | Payer: No Typology Code available for payment source | Source: Ambulatory Visit | Attending: Cardiology | Admitting: Cardiology

## 2020-12-14 DIAGNOSIS — R945 Abnormal results of liver function studies: Secondary | ICD-10-CM | POA: Diagnosis not present

## 2020-12-14 DIAGNOSIS — R932 Abnormal findings on diagnostic imaging of liver and biliary tract: Secondary | ICD-10-CM | POA: Insufficient documentation

## 2020-12-14 DIAGNOSIS — R7989 Other specified abnormal findings of blood chemistry: Secondary | ICD-10-CM

## 2020-12-14 DIAGNOSIS — J439 Emphysema, unspecified: Secondary | ICD-10-CM | POA: Diagnosis not present

## 2020-12-14 MED ORDER — GADOBUTROL 1 MMOL/ML IV SOLN
9.0000 mL | Freq: Once | INTRAVENOUS | Status: AC | PRN
  Administered 2020-12-14: 9 mL via INTRAVENOUS

## 2020-12-14 NOTE — Progress Notes (Signed)
Daily Session Note  Patient Details  Name: Jacob Gilmore MRN: [id number] Date of Birth: 11/16/1970 Referring Provider:   April Manson Pulmonary Rehab Walk Test from 11/22/2020 in [place]  [facility]      Encounter Date: 12/14/2020  Check In:  Session Check In - 12/14/20 1433      Check-In   Supervising physician immediately available to respond to emergencies [facility] immediately available    Physician(s) Dr. Venetia Constable    Location [facility]    Staff Present Rosebud Poles, RN, Isaac Laud, MS, ACSM-CEP, Exercise Physiologist;Tenesia Escudero Ysidro Evert, RN    Virtual Visit No    Medication changes reported     No    Fall or balance concerns reported    No    Tobacco Cessation No Change    Warm-up and Cool-down Performed on first and last piece of equipment    Resistance Training Performed Yes    VAD Patient? No    PAD/SET Patient? No      Pain Assessment   Currently in Pain? No/denies    Multiple Pain Sites No           Capillary Blood Glucose: No results found for this or any previous visit (from the past 24 hour(s)).    Social History   Tobacco Use  Smoking Status Former Smoker  . Types: Cigarettes  . Quit date: 12/01/2013  . Years since quitting: 7.0  Smokeless Tobacco Never Used    Goals Met:  Exercise tolerated well No report of cardiac concerns or symptoms Strength training completed today  Goals Unmet:  Not Applicable  Comments: Service time is from 1325 to 1441    Dr. Fransico Him is Medical Director for Cardiac Rehab at [facility].

## 2020-12-14 NOTE — Progress Notes (Signed)
Pulmonary Individual Treatment Plan  Patient Details  Name: Jacob Gilmore MRN: [id number] Date of Birth: 19-May-1971 Referring Provider:   April Manson Pulmonary Rehab Walk Test from 11/22/2020 in [place]  Referring Provider [place]      Initial Encounter Date:  Flowsheet Row Pulmonary Rehab Walk Test from 11/22/2020 in [place]  Date 11/22/20      Visit Diagnosis: Pulmonary emphysema, unspecified emphysema type ([place])  Patient's Home Medications on Admission:   Current Outpatient Medications:  .  albuterol (VENTOLIN HFA) 108 (90 Base) MCG/ACT inhaler, Inhale 2 puffs into the lungs as needed., Disp: , Rfl:  .  ARTIFICIAL SALIVA MT, Take 4 sprays by mouth at bedtime as needed., Disp: , Rfl:  .  diphenhydrAMINE (BENADRYL) 25 mg capsule, Take 25 mg by mouth 4 (four) times daily as needed., Disp: , Rfl:  .  doxepin (SINEQUAN) 10 MG capsule, Take 10 mg by mouth at bedtime., Disp: , Rfl:  .  insulin aspart protamine - aspart (NOVOLOG MIX 70/30 FLEXPEN) (70-30) 100 UNIT/ML FlexPen, Inject 30 Units into the skin 2 (two) times daily before a meal., Disp: , Rfl:  .  mometasone (ASMANEX) 220 MCG/INH inhaler, Inhale 2 puffs into the lungs at bedtime., Disp: , Rfl:  .  Semaglutide,0.25 or 0.5MG /DOS, 2 MG/1.5ML SOPN, Inject 0.5 mg into the skin once a week., Disp: , Rfl:  .  sildenafil (VIAGRA) 100 MG tablet, Take 50 mg by mouth as needed., Disp: , Rfl:  .  tamsulosin (FLOMAX) 0.4 MG CAPS capsule, Take 1 capsule by mouth at bedtime., Disp: , Rfl:  .  Tiotropium Bromide-Olodaterol 2.5-2.5 MCG/ACT AERS, INHALE 2 PUFFS BY MOUTH IN THE MORNING, Disp: , Rfl:  .  trospium (SANCTURA) 20 MG tablet, Take 20 mg by mouth 2 (two) times daily. Do not take with oxybutynin, Disp: , Rfl:   Past Medical History: Past Medical History:  Diagnosis Date  . Alcoholism ([place])   . Anxiety   . Asthma   . Atrial fibrillation ([place])   . Colon polyps   . COPD  (chronic obstructive pulmonary disease) ([place])   . Diabetes mellitus without complication ([place])   . Kidney stone   . Pneumonia   . Pulmonary sarcoidosis ([place])   . Sarcoidosis   . Sleep apnea    CPAP    Tobacco Use: Social History   Tobacco Use  Smoking Status Former Smoker  . Types: Cigarettes  . Quit date: 12/01/2013  . Years since quitting: 7.0  Smokeless Tobacco Never Used    Labs: Recent Review Flowsheet Data   There is no flowsheet data to display.     Capillary Blood Glucose: Lab Results  Component Value Date   GLUCAP 186 (H) 11/30/2020    POCT Glucose    Row Name 12/07/20 1507             POCT Blood Glucose   Pre-Exercise 186 mg/dL       Post-Exercise 151 mg/dL              Pulmonary Assessment Scores:  Pulmonary Assessment Scores    Row Name 11/22/20 1001 11/22/20 1056       ADL UCSD   ADL Phase Entry --    SOB Score total 46 --         CAT Score   CAT Score 30 --         mMRC Score   mMRC Score -- 4  UCSD: Self-administered rating of dyspnea associated with activities of daily living (ADLs) 6-point scale (0 = "not at all" to 5 = "maximal or unable to do because of breathlessness")  Scoring Scores range from 0 to 120.  Minimally important difference is 5 units  CAT: CAT can identify the health impairment of COPD patients and is better correlated with disease progression.  CAT has a scoring range of zero to 40. The CAT score is classified into four groups of low (less than 10), medium (10 - 20), high (21-30) and very high (31-40) based on the impact level of disease on health status. A CAT score over 10 suggests significant symptoms.  A worsening CAT score could be explained by an exacerbation, poor medication adherence, poor inhaler technique, or progression of COPD or comorbid conditions.  CAT MCID is 2 points  mMRC: mMRC (Modified Medical Research Council) Dyspnea Scale is used to assess the degree of baseline functional  disability in patients of respiratory disease due to dyspnea. No minimal important difference is established. A decrease in score of 1 point or greater is considered a positive change.   Pulmonary Function Assessment:  Pulmonary Function Assessment - 11/22/20 0958      Breath   Shortness of Breath Yes;Fear of Shortness of Breath;Limiting activity;Panic with Shortness of Breath           Exercise Target Goals: Exercise Program Goal: Individual exercise prescription set using results from initial 6 min walk test and THRR while considering  patient's activity barriers and safety.   Exercise Prescription Goal: Initial exercise prescription builds to 30-45 minutes a day of aerobic activity, 2-3 days per week.  Home exercise guidelines will be given to patient during program as part of exercise prescription that the participant will acknowledge.  Activity Barriers & Risk Stratification:  Activity Barriers & Cardiac Risk Stratification - 11/22/20 0955      Activity Barriers & Cardiac Risk Stratification   Activity Barriers Deconditioning;Muscular Weakness;Shortness of Breath           6 Minute Walk:  6 Minute Walk    Row Name 11/22/20 1041         6 Minute Walk   Phase Initial     Distance 1016 feet     Walk Time 6 minutes     # of Rest Breaks 0     MPH 1.92     METS 3.63     RPE 13     Perceived Dyspnea  2     VO2 Peak 12.69     Symptoms No     Resting HR 93 bpm     Resting BP 110/86     Resting Oxygen Saturation  98 %     Exercise Oxygen Saturation  during 6 min walk 86 %     Max Ex. HR 101 bpm     Max Ex. BP 120/90     2 Minute Post BP 108/88           Interval HR   1 Minute HR 101     2 Minute HR 93     3 Minute HR 91     4 Minute HR 94     5 Minute HR 95     6 Minute HR 100     2 Minute Post HR 90     Interval Heart Rate? Yes           Interval Oxygen   Interval Oxygen? Yes  Baseline Oxygen Saturation % 98 %     1 Minute Oxygen Saturation % 95 %      1 Minute Liters of Oxygen 0 L     2 Minute Oxygen Saturation % 95 %     2 Minute Liters of Oxygen 0 L     3 Minute Oxygen Saturation % 95 %     3 Minute Liters of Oxygen 0 L     4 Minute Oxygen Saturation % 93 %     4 Minute Liters of Oxygen 0 L     5 Minute Oxygen Saturation % 90 %     5 Minute Liters of Oxygen 0 L     6 Minute Oxygen Saturation % 86 %     6 Minute Liters of Oxygen 0 L     2 Minute Post Oxygen Saturation % 92 %     2 Minute Post Liters of Oxygen 0 L            Oxygen Initial Assessment:  Oxygen Initial Assessment - 11/22/20 1039      Home Oxygen   Home Oxygen Device None    Sleep Oxygen Prescription None    Home Exercise Oxygen Prescription None    Home Resting Oxygen Prescription None      Initial 6 min Walk   Oxygen Used None      Program Oxygen Prescription   Program Oxygen Prescription None      Intervention   Short Term Goals To learn and understand importance of monitoring SPO2 with pulse oximeter and demonstrate accurate use of the pulse oximeter.;To learn and understand importance of maintaining oxygen saturations>88%;To learn and demonstrate proper pursed lip breathing techniques or other breathing techniques.;To learn and demonstrate proper use of respiratory medications    Long  Term Goals Verbalizes importance of monitoring SPO2 with pulse oximeter and return demonstration;Maintenance of O2 saturations>88%;Exhibits proper breathing techniques, such as pursed lip breathing or other method taught during program session;Compliance with respiratory medication;Demonstrates proper use of MDI's           Oxygen Re-Evaluation:  Oxygen Re-Evaluation    Row Name 12/13/20 1631             Program Oxygen Prescription   Program Oxygen Prescription None               Home Oxygen   Home Oxygen Device None       Sleep Oxygen Prescription None       Home Exercise Oxygen Prescription None       Home Resting Oxygen Prescription None                Goals/Expected Outcomes   Short Term Goals To learn and understand importance of monitoring SPO2 with pulse oximeter and demonstrate accurate use of the pulse oximeter.;To learn and understand importance of maintaining oxygen saturations>88%;To learn and demonstrate proper pursed lip breathing techniques or other breathing techniques.;To learn and demonstrate proper use of respiratory medications       Long  Term Goals Verbalizes importance of monitoring SPO2 with pulse oximeter and return demonstration;Maintenance of O2 saturations>88%;Exhibits proper breathing techniques, such as pursed lip breathing or other method taught during program session;Compliance with respiratory medication;Demonstrates proper use of MDI's       Goals/Expected Outcomes compliance and understanding of oxygen saturation and pursed lip breathing. Compliance with monitoring oxygen saturation at home.              Oxygen  Discharge (Final Oxygen Re-Evaluation):  Oxygen Re-Evaluation - 12/13/20 1631      Program Oxygen Prescription   Program Oxygen Prescription None      Home Oxygen   Home Oxygen Device None    Sleep Oxygen Prescription None    Home Exercise Oxygen Prescription None    Home Resting Oxygen Prescription None      Goals/Expected Outcomes   Short Term Goals To learn and understand importance of monitoring SPO2 with pulse oximeter and demonstrate accurate use of the pulse oximeter.;To learn and understand importance of maintaining oxygen saturations>88%;To learn and demonstrate proper pursed lip breathing techniques or other breathing techniques.;To learn and demonstrate proper use of respiratory medications    Long  Term Goals Verbalizes importance of monitoring SPO2 with pulse oximeter and return demonstration;Maintenance of O2 saturations>88%;Exhibits proper breathing techniques, such as pursed lip breathing or other method taught during program session;Compliance with respiratory  medication;Demonstrates proper use of MDI's    Goals/Expected Outcomes compliance and understanding of oxygen saturation and pursed lip breathing. Compliance with monitoring oxygen saturation at home.           Initial Exercise Prescription:  Initial Exercise Prescription - 11/22/20 1000      Date of Initial Exercise RX and Referring Provider   Date 11/22/20    Referring Provider [facility]    Expected Discharge Date 01/27/21      Treadmill   MPH 1.8    Grade 0    Minutes 15      NuStep   Level 2    SPM 80    Minutes 15      Prescription Details   Frequency (times per week) 2    Duration Progress to 45 minutes of aerobic exercise without signs/symptoms of physical distress      Intensity   THRR 40-80% of Max Heartrate 68-136    Ratings of Perceived Exertion 11-13    Perceived Dyspnea 0-4      Progression   Progression Continue to progress workloads to maintain intensity without signs/symptoms of physical distress.      Resistance Training   Training Prescription Yes    Weight blue bands    Reps 10-15           Perform Capillary Blood Glucose checks as needed.  Exercise Prescription Changes:  Exercise Prescription Changes    Row Name 12/07/20 1500 12/13/20 1600           Response to Exercise   Blood Pressure (Admit) 128/87 --      Blood Pressure (Exercise) 150/80 --      Blood Pressure (Exit) 118/86 --      Heart Rate (Admit) 100 bpm --      Heart Rate (Exercise) 113 bpm --      Heart Rate (Exit) 95 bpm --      Oxygen Saturation (Admit) 99 % --      Oxygen Saturation (Exercise) 93 % --      Oxygen Saturation (Exit) 98 % --      Rating of Perceived Exertion (Exercise) 15 --      Perceived Dyspnea (Exercise) 2.5 --      Duration Continue with 30 min of aerobic exercise without signs/symptoms of physical distress. --      Intensity THRR unchanged --             Progression   Progression Continue to progress workloads to maintain intensity without  signs/symptoms of physical distress. --  Average METs -- 2.05             Resistance Training   Training Prescription Yes --      Weight blue bands --      Reps 10-15 --      Time 10 Minutes --             NuStep   Level 2 --      SPM 80 --      Minutes 15 --      METs 1.9 --             Exercise Comments:  Exercise Comments    Row Name 11/30/20 1505           Exercise Comments Patient completed first day of exercise and tolerated well with no complaints or concerns. Pt was able to do 15 minutes on the Nustep with no breaks. He also walked for 15 minutes and had to take a couple of rest breaks due to SOB. Pre CBG 224, post CBG 186.              Exercise Goals and Review:  Exercise Goals    Row Name 11/22/20 1037             Exercise Goals   Increase Physical Activity Yes       Intervention Provide advice, education, support and counseling about physical activity/exercise needs.;Develop an individualized exercise prescription for aerobic and resistive training based on initial evaluation findings, risk stratification, comorbidities and participant's personal goals.       Expected Outcomes Short Term: Attend rehab on a regular basis to increase amount of physical activity.;Long Term: Add in home exercise to make exercise part of routine and to increase amount of physical activity.;Long Term: Exercising regularly at least 3-5 days a week.       Increase Strength and Stamina Yes       Intervention Provide advice, education, support and counseling about physical activity/exercise needs.;Develop an individualized exercise prescription for aerobic and resistive training based on initial evaluation findings, risk stratification, comorbidities and participant's personal goals.       Expected Outcomes Short Term: Increase workloads from initial exercise prescription for resistance, speed, and METs.;Short Term: Perform resistance training exercises routinely during rehab and  add in resistance training at home;Long Term: Improve cardiorespiratory fitness, muscular endurance and strength as measured by increased METs and functional capacity (6MWT)       Able to understand and use rate of perceived exertion (RPE) scale Yes       Intervention Provide education and explanation on how to use RPE scale       Expected Outcomes Short Term: Able to use RPE daily in rehab to express subjective intensity level;Long Term:  Able to use RPE to guide intensity level when exercising independently       Able to understand and use Dyspnea scale Yes       Intervention Provide education and explanation on how to use Dyspnea scale       Expected Outcomes Short Term: Able to use Dyspnea scale daily in rehab to express subjective sense of shortness of breath during exertion;Long Term: Able to use Dyspnea scale to guide intensity level when exercising independently       Knowledge and understanding of Target Heart Rate Range (THRR) Yes       Intervention Provide education and explanation of THRR including how the numbers were predicted and where they are located for reference  Expected Outcomes Short Term: Able to state/look up THRR;Long Term: Able to use THRR to govern intensity when exercising independently;Short Term: Able to use daily as guideline for intensity in rehab       Understanding of Exercise Prescription Yes       Intervention Provide education, explanation, and written materials on patient's individual exercise prescription       Expected Outcomes Short Term: Able to explain program exercise prescription;Long Term: Able to explain home exercise prescription to exercise independently              Exercise Goals Re-Evaluation :  Exercise Goals Re-Evaluation    Row Name 12/13/20 1628             Exercise Goal Re-Evaluation   Exercise Goals Review Increase Physical Activity;Increase Strength and Stamina;Able to understand and use rate of perceived exertion (RPE)  scale;Able to understand and use Dyspnea scale;Knowledge and understanding of Target Heart Rate Range (THRR);Understanding of Exercise Prescription       Comments Patient has completed 2 exercise sessions and has tolerated well so far. It is too early to see any progression, but we will continue to monitor and progress as he is able. He is beginning to be independent with the equipment and resistance bands. He is able to do all exercises with no complaints. He is exercising at 1.9 METS on the Nustep and 2.05 METS walking the track.       Expected Outcomes Through exercise at rehab and home the patient will decrease shortness of breath with daily activities and feel confident in carrying out an exercise regimn at home.              Discharge Exercise Prescription (Final Exercise Prescription Changes):  Exercise Prescription Changes - 12/13/20 1600      Progression   Average METs 2.05           Nutrition:  Target Goals: Understanding of nutrition guidelines, daily intake of sodium <1562m, cholesterol <2058m calories 30% from fat and 7% or less from saturated fats, daily to have 5 or more servings of fruits and vegetables.  Biometrics:  Pre Biometrics - 11/22/20 0956      Pre Biometrics   Height _0  (1.88 m)    Weight 92.4 kg    BMI (Calculated) 26.14    Grip Strength 45.5 kg            Nutrition Therapy Plan and Nutrition Goals:  Nutrition Therapy & Goals - 12/09/20 0913      Nutrition Therapy   RD appointment deferred Yes           Nutrition Assessments:  MEDIFICTS Score Key:  ?70 Need to make dietary changes   40-70 Heart Healthy Diet  ? 40 Therapeutic Level Cholesterol Diet   Picture Your Plate Scores:  <4<00nhealthy dietary pattern with much room for improvement.  41-50 Dietary pattern unlikely to meet recommendations for good health and room for improvement.  51-60 More healthful dietary pattern, with some room for improvement.   >60 Healthy  dietary pattern, although there may be some specific behaviors that could be improved.    Nutrition Goals Re-Evaluation:   Nutrition Goals Discharge (Final Nutrition Goals Re-Evaluation):   Psychosocial: Target Goals: Acknowledge presence or absence of significant depression and/or stress, maximize coping skills, provide positive support system. Participant is able to verbalize types and ability to use techniques and skills needed for reducing stress and depression.  Initial Review & Psychosocial Screening:  Initial Psych Review & Screening - 11/22/20 1001      Initial Review   Current issues with None Identified      Family Dynamics   Good Support System? Yes      Barriers   Psychosocial barriers to participate in program There are no identifiable barriers or psychosocial needs.      Screening Interventions   Interventions Encouraged to exercise           Quality of Life Scores:  Scores of 19 and below usually indicate a poorer quality of life in these areas.  A difference of  2-3 points is a clinically meaningful difference.  A difference of 2-3 points in the total score of the Quality of Life Index has been associated with significant improvement in overall quality of life, self-image, physical symptoms, and general health in studies assessing change in quality of life.  PHQ-9: Recent Review Flowsheet Data    Depression screen [facility] 2/9 11/22/2020   Decreased Interest 0   Down, Depressed, Hopeless 0   PHQ - 2 Score 0   Altered sleeping 0   Tired, decreased energy 2   Change in appetite 0   Feeling bad or failure about yourself  0   Trouble concentrating 0   Moving slowly or fidgety/restless 0   Suicidal thoughts 0   Difficult doing work/chores Not difficult at all     Interpretation of Total Score  Total Score Depression Severity:  1-4 = Minimal depression, 5-9 = Mild depression, 10-14 = Moderate depression, 15-19 = Moderately severe depression, 20-27 = Severe  depression   Psychosocial Evaluation and Intervention:  Psychosocial Evaluation - 11/22/20 1002      Psychosocial Evaluation & Interventions   Interventions Encouraged to exercise with the program and follow exercise prescription    Comments No barriers identified    Continue Psychosocial Services  No Follow up required           Psychosocial Re-Evaluation:  Psychosocial Re-Evaluation    [place] Name 12/13/20 1345             Psychosocial Re-Evaluation   Current issues with None Identified       Comments No concerns identified.       Interventions Encouraged to attend Pulmonary Rehabilitation for the exercise       Continue Psychosocial Services  No Follow up required              Psychosocial Discharge (Final Psychosocial Re-Evaluation):  Psychosocial Re-Evaluation - 12/13/20 1345      Psychosocial Re-Evaluation   Current issues with None Identified    Comments No concerns identified.    Interventions Encouraged to attend Pulmonary Rehabilitation for the exercise    Continue Psychosocial Services  No Follow up required           Education: Education Goals: Education classes will be provided on a weekly basis, covering required topics. Participant will state understanding/return demonstration of topics presented.  Learning Barriers/Preferences:  Learning Barriers/Preferences - 11/22/20 1002      Learning Barriers/Preferences   Learning Barriers None    Learning Preferences Computer/Internet           Education Topics: Risk Factor Reduction:  -Group instruction that is supported by a PowerPoint presentation. Instructor discusses the definition of a risk factor, different risk factors for pulmonary disease, and how the heart and lungs work together.     Nutrition for Pulmonary Patient:  -Group instruction provided by PowerPoint slides, verbal discussion,  and written materials to support subject matter. The instructor gives an explanation and review of  healthy diet recommendations, which includes a discussion on weight management, recommendations for fruit and vegetable consumption, as well as protein, fluid, caffeine, fiber, sodium, sugar, and alcohol. Tips for eating when patients are short of breath are discussed.   Pursed Lip Breathing:  -Group instruction that is supported by demonstration and informational handouts. Instructor discusses the benefits of pursed lip and diaphragmatic breathing and detailed demonstration on how to preform both.     Oxygen Safety:  -Group instruction provided by PowerPoint, verbal discussion, and written material to support subject matter. There is an overview of "What is Oxygen" and "Why do we need it".  Instructor also reviews how to create a safe environment for oxygen use, the importance of using oxygen as prescribed, and the risks of noncompliance. There is a brief discussion on traveling with oxygen and resources the patient may utilize.   Oxygen Equipment:  -Group instruction provided by [facility] Staff utilizing handouts, written materials, and equipment demonstrations.   Signs and Symptoms:  -Group instruction provided by written material and verbal discussion to support subject matter. Warning signs and symptoms of infection, stroke, and heart attack are reviewed and when to call the physician/911 reinforced. Tips for preventing the spread of infection discussed.   Advanced Directives:  -Group instruction provided by verbal instruction and written material to support subject matter. Instructor reviews Advanced Directive laws and proper instruction for filling out document.   Pulmonary Video:  -Group video education that reviews the importance of medication and oxygen compliance, exercise, good nutrition, pulmonary hygiene, and pursed lip and diaphragmatic breathing for the pulmonary patient.   Exercise for the Pulmonary Patient:  -Group instruction that is supported by a PowerPoint  presentation. Instructor discusses benefits of exercise, core components of exercise, frequency, duration, and intensity of an exercise routine, importance of utilizing pulse oximetry during exercise, safety while exercising, and options of places to exercise outside of rehab.     Pulmonary Medications:  -Verbally interactive group education provided by instructor with focus on inhaled medications and proper administration.   Anatomy and Physiology of the Respiratory System and Intimacy:  -Group instruction provided by PowerPoint, verbal discussion, and written material to support subject matter. Instructor reviews respiratory cycle and anatomical components of the respiratory system and their functions. Instructor also reviews differences in obstructive and restrictive respiratory diseases with examples of each. Intimacy, Sex, and Sexuality differences are reviewed with a discussion on how relationships can change when diagnosed with pulmonary disease. Common sexual concerns are reviewed.   MD DAY -A group question and answer session with a medical doctor that allows participants to ask questions that relate to their pulmonary disease state.   OTHER EDUCATION -Group or individual verbal, written, or video instructions that support the educational goals of the pulmonary rehab program. [place] from 12/07/2020 in [place]  Date 12/07/20  Educator Hand out  [facility] [place] Plate]      Holiday Eating Survival Tips:  -Group instruction provided by PowerPoint slides, verbal discussion, and written materials to support subject matter. The instructor gives patients tips, tricks, and techniques to help them not only survive but enjoy the holidays despite the onslaught of food that accompanies the holidays.   Knowledge Questionnaire Score:  Knowledge Questionnaire Score - 11/22/20 1019      Knowledge Questionnaire Score   Pre Score  14/18  Core Components/Risk Factors/Patient Goals at Admission:  Personal Goals and Risk Factors at Admission - 11/22/20 1003      Core Components/Risk Factors/Patient Goals on Admission   Improve shortness of breath with ADL's Yes    Intervention Provide education, individualized exercise plan and daily activity instruction to help decrease symptoms of SOB with activities of daily living.    Expected Outcomes Short Term: Improve cardiorespiratory fitness to achieve a reduction of symptoms when performing ADLs;Long Term: Be able to perform more ADLs without symptoms or delay the onset of symptoms           Core Components/Risk Factors/Patient Goals Review:   Goals and Risk Factor Review    Row Name 11/22/20 1003 12/13/20 1345           Core Components/Risk Factors/Patient Goals Review   Personal Goals Review Develop more efficient breathing techniques such as purse lipped breathing and diaphragmatic breathing and practicing self-pacing with activity.;Increase knowledge of respiratory medications and ability to use respiratory devices properly.;Improve shortness of breath with ADL's Develop more efficient breathing techniques such as purse lipped breathing and diaphragmatic breathing and practicing self-pacing with activity.;Increase knowledge of respiratory medications and ability to use respiratory devices properly.;Improve shortness of breath with ADL's      Review -- Has attended 2 exercise sessions, too early to have met any program goals.      Expected Outcomes -- See admission goals.             Core Components/Risk Factors/Patient Goals at Discharge (Final Review):   Goals and Risk Factor Review - 12/13/20 1345      Core Components/Risk Factors/Patient Goals Review   Personal Goals Review Develop more efficient breathing techniques such as purse lipped breathing and diaphragmatic breathing and practicing self-pacing with activity.;Increase knowledge of  respiratory medications and ability to use respiratory devices properly.;Improve shortness of breath with ADL's    Review Has attended 2 exercise sessions, too early to have met any program goals.    Expected Outcomes See admission goals.           ITP Comments:   Comments: ITP REVIEW Pt is making expected progress toward pulmonary rehab goals after completing 2 sessions. Recommend continued exercise, life style modification, education, and utilization of breathing techniques to increase stamina and strength and decrease shortness of breath with exertion.

## 2020-12-16 ENCOUNTER — Other Ambulatory Visit: Payer: Self-pay

## 2020-12-16 ENCOUNTER — Encounter (HOSPITAL_COMMUNITY)
Admission: RE | Admit: 2020-12-16 | Discharge: 2020-12-16 | Disposition: A | Discharge status: Home / Self Care | Payer: No Typology Code available for payment source | Source: Ambulatory Visit | Attending: Cardiology | Admitting: Cardiology

## 2020-12-16 VITALS — Wt 205.2 lb

## 2020-12-16 DIAGNOSIS — J439 Emphysema, unspecified: Secondary | ICD-10-CM | POA: Diagnosis not present

## 2020-12-16 NOTE — Progress Notes (Signed)
Daily Session Note  Patient Details  Name: Jacob Gilmore MRN: [id number] Date of Birth: 09-29-1971 Referring Provider:   April Manson Pulmonary Rehab Walk Test from 11/22/2020 in [place]  [facility]      Encounter Date: 12/16/2020  Check In:  Session Check In - 12/16/20 1428      Check-In   Supervising physician immediately available to respond to emergencies [facility] immediately available    Physician(s) Dr. Doristine Bosworth    Location [facility]    Staff Present Rosebud Poles, RN, BSN;Lisa Ysidro Evert, RN;Miryah Ralls Hassell Done, MS, ACSM-CEP, Exercise Physiologist    Virtual Visit No    Medication changes reported     No    Fall or balance concerns reported    No    Tobacco Cessation No Change    Warm-up and Cool-down Performed on first and last piece of equipment    Resistance Training Performed Yes    VAD Patient? No    PAD/SET Patient? No      Pain Assessment   Currently in Pain? No/denies    Multiple Pain Sites No           Capillary Blood Glucose: No results found for this or any previous visit (from the past 24 hour(s)).    Social History   Tobacco Use  Smoking Status Former Smoker  . Types: Cigarettes  . Quit date: 12/01/2013  . Years since quitting: 7.0  Smokeless Tobacco Never Used    Goals Met:  Proper associated with RPD/PD & O2 Sat Exercise tolerated well No report of cardiac concerns or symptoms Strength training completed today  Goals Unmet:  Not Applicable  Comments: Service time is from 1325 to 1430    Dr. Fransico Him is Medical Director for Cardiac Rehab at [facility].

## 2020-12-16 NOTE — Progress Notes (Signed)
I have reviewed a Home Exercise Prescription with Jacob Gilmore . Jacob Gilmore is not currently exercising at home. The patient was advised to walk and do resistance training 2 additional days a week for 30-45 minutes. Patient verbalized understanding and stated he would start home exercise. Jacob Gilmore and I discussed how to progress their exercise prescription. The patient stated that their goals were to have a better quality of life, be able to do ADL's without shortness of breath, and to breathe better. The patient stated that they understand the exercise prescription. We reviewed exercise guidelines, target heart rate during exercise, RPE Scale, weather conditions, use of rescue inhaler, endpoints for exercise, warmup and cool down.  Patient is encouraged to come to me with any questions. I will continue to follow up with the patient to assist them with progression and safety.    Jacob Cross MS, ACSM CEP

## 2020-12-21 ENCOUNTER — Encounter (HOSPITAL_COMMUNITY): Payer: No Typology Code available for payment source

## 2020-12-23 ENCOUNTER — Encounter (HOSPITAL_COMMUNITY): Payer: No Typology Code available for payment source

## 2020-12-28 ENCOUNTER — Telehealth (HOSPITAL_COMMUNITY): Payer: Self-pay | Admitting: *Deleted

## 2020-12-28 ENCOUNTER — Encounter (HOSPITAL_COMMUNITY): Payer: No Typology Code available for payment source

## 2020-12-30 ENCOUNTER — Encounter (HOSPITAL_COMMUNITY)
Admission: RE | Admit: 2020-12-30 | Discharge: 2020-12-30 | Disposition: A | Discharge status: Home / Self Care | Payer: No Typology Code available for payment source | Source: Ambulatory Visit | Attending: Cardiology | Admitting: Cardiology

## 2020-12-30 ENCOUNTER — Other Ambulatory Visit: Payer: Self-pay

## 2020-12-30 VITALS — Wt 203.3 lb

## 2020-12-30 DIAGNOSIS — J439 Emphysema, unspecified: Secondary | ICD-10-CM

## 2020-12-30 NOTE — Progress Notes (Signed)
Daily Session Note  Patient Details  Name: Jacob Gilmore MRN: [id number] Date of Birth: 1971/01/26 Referring Provider:   April Manson Pulmonary Rehab Walk Test from 11/22/2020 in [place]  [facility]      Encounter Date: 12/30/2020  Check In:  Session Check In - 12/30/20 1353      Check-In   Supervising physician immediately available to respond to emergencies [facility] immediately available    Physician(s) Dr, Florencia Reasons    [facility]    Staff Present Rosebud Poles, RN, Milus Glazier, MS, EP-C, CCRP;Jessica Hassell Done, MS, ACSM-CEP, Exercise Physiologist    Virtual Visit No    Medication changes reported     No    Fall or balance concerns reported    No    Tobacco Cessation No Change    Warm-up and Cool-down Performed on first and last piece of equipment    Resistance Training Performed Yes    VAD Patient? No    PAD/SET Patient? No      Pain Assessment   Currently in Pain? No/denies    Multiple Pain Sites No           Capillary Blood Glucose: No results found for this or any previous visit (from the past 24 hour(s)).    Social History   Tobacco Use  Smoking Status Former Smoker  . Types: Cigarettes  . Quit date: 12/01/2013  . Years since quitting: 7.0  Smokeless Tobacco Never Used    Goals Met:  Proper associated with RPD/PD & O2 Sat Exercise tolerated well Strength training completed today  Goals Unmet:  Not Applicable  Comments: Service time is from 1330 to [place]    Dr. Fransico Him is Medical Director for Cardiac Rehab at [facility].

## 2021-01-04 ENCOUNTER — Telehealth (HOSPITAL_COMMUNITY): Payer: Self-pay | Admitting: General Practice

## 2021-01-04 ENCOUNTER — Encounter (HOSPITAL_COMMUNITY): Payer: No Typology Code available for payment source

## 2021-01-06 ENCOUNTER — Encounter (HOSPITAL_COMMUNITY)
Admission: RE | Admit: 2021-01-06 | Discharge: 2021-01-06 | Disposition: A | Discharge status: Home / Self Care | Payer: No Typology Code available for payment source | Source: Ambulatory Visit | Attending: Cardiology | Admitting: Cardiology

## 2021-01-06 ENCOUNTER — Other Ambulatory Visit: Payer: Self-pay

## 2021-01-06 DIAGNOSIS — J439 Emphysema, unspecified: Secondary | ICD-10-CM | POA: Diagnosis not present

## 2021-01-06 NOTE — Progress Notes (Signed)
Stevenson Clinch 50 y.o. male Nutrition Note  Diagnosis: Pulmonary Emphysema   Past Medical History:  Diagnosis Date  . Alcoholism (HCC)   . Anxiety   . Asthma   . Atrial fibrillation (HCC)   . Colon polyps   . COPD (chronic obstructive pulmonary disease) (HCC)   . Diabetes mellitus without complication (HCC)   . Kidney stone   . Pneumonia   . Pulmonary sarcoidosis (HCC)   . Sarcoidosis   . Sleep apnea    CPAP     Medications reviewed.   Current Outpatient Medications:  .  albuterol (VENTOLIN HFA) 108 (90 Base) MCG/ACT inhaler, Inhale 2 puffs into the lungs as needed., Disp: , Rfl:  .  ARTIFICIAL SALIVA MT, Take 4 sprays by mouth at bedtime as needed., Disp: , Rfl:  .  diphenhydrAMINE (BENADRYL) 25 mg capsule, Take 25 mg by mouth 4 (four) times daily as needed., Disp: , Rfl:  .  doxepin (SINEQUAN) 10 MG capsule, Take 10 mg by mouth at bedtime., Disp: , Rfl:  .  insulin aspart protamine - aspart (NOVOLOG MIX 70/30 FLEXPEN) (70-30) 100 UNIT/ML FlexPen, Inject 30 Units into the skin 2 (two) times daily before a meal., Disp: , Rfl:  .  mometasone (ASMANEX) 220 MCG/INH inhaler, Inhale 2 puffs into the lungs at bedtime., Disp: , Rfl:  .  Semaglutide,0.25 or 0.5MG /DOS, 2 MG/1.5ML SOPN, Inject 0.5 mg into the skin once a week., Disp: , Rfl:  .  sildenafil (VIAGRA) 100 MG tablet, Take 50 mg by mouth as needed., Disp: , Rfl:  .  tamsulosin (FLOMAX) 0.4 MG CAPS capsule, Take 1 capsule by mouth at bedtime., Disp: , Rfl:  .  Tiotropium Bromide-Olodaterol 2.5-2.5 MCG/ACT AERS, INHALE 2 PUFFS BY MOUTH IN THE MORNING, Disp: , Rfl:  .  trospium (SANCTURA) 20 MG tablet, Take 20 mg by mouth 2 (two) times daily. Do not take with oxybutynin, Disp: , Rfl:    Ht Readings from Last 1 Encounters:  12/01/20 6' 0.25" (1.835 m)     Wt Readings from Last 3 Encounters:  01/04/21 203 lb 4.2 oz (92.2 kg)  12/16/20 205 lb 4 oz (93.1 kg)  12/09/20 206 lb 2.1 oz (93.5 kg)     There is no height or weight  on file to calculate BMI.   Social History   Tobacco Use  Smoking Status Former Smoker  . Types: Cigarettes  . Quit date: 12/01/2013  . Years since quitting: 7.1  Smokeless Tobacco Never Used      Nutrition Note  Spoke with pt. Nutrition Plan and Nutrition Survey goals reviewed with pt.   Pt has Type 2 Diabetes. Last A1C was 10.9% which was down from 13%. Pt checks CBG's 1-2 times a day. Fasting CBG's reportedly 120-160 mg/dL. 2 hr post prandial 170-180 mg/dl. He is not taking Novolog 70/30. He is taking Ozempic 0.5 mg. Discussed barriers to diabetes management. Pt is seeing RD at [facility] for past 1.5 years. He is eating balanced meals and talking knowledgeably about nutrition and diabetes. Reassured pt of decreased risk of hypoglycemia while taking only Ozempic. He was fearful of hypoglycemia and did not realize he was at low risk now that he is not taking insulin. When on insulin therapy, he was injecting in same spot which was causing pain. He has been educated on site rotations. He did not always take his insulin at the same time and felt like he had very little knowledge about his diabetes. After seeing RD  at [place], he feels more comfortable with his diabetes knowledge and management.  Reviewed glycemic targets. Pt is estimating carbs per meal. He shoots for 1/2 cup starch at meals or 40 g carbs on labels. Diet recall: Breakfast: yogurt, granola, boiled eggs, fruit, and sometimes toast Snack: peanuts or granola bar Lunch: balanced meal with protein, 1/2 c starch, and veggies Dinner: Similar to lunch Snack: chips, peanuts, or granola bar. Drinks: water and diet coke sometimes  Pt has some difficulty carrying groceries to car. He has been overcoming this barrier by ordering groceries for delivery.   Pt expressed understanding of the information reviewed.   Nutrition Diagnosis ? Food-and nutrition-related knowledge deficit related to lack of exposure to information as related to diagnosis  of: ? CVD ? Type 2 Diabetes  Nutrition Intervention ? Pt's individual nutrition plan reviewed with pt. ? Benefits of adopting healthy diet reviewed with Rate My Plate survey ? Continue client-centered nutrition education by RD, as part of interdisciplinary care.  Goal(s) ? CBG concentrations in the normal range or as close to normal as is safely possible. ? Improved blood glucose control as evidenced by pt's A1c trending from 10.9 toward less than 7.0.  Plan:   Will provide client-centered nutrition education as part of interdisciplinary care  Monitor and evaluate progress toward nutrition goal with team.   Andrey Campanile, MS, RDN, LDN

## 2021-01-06 NOTE — Progress Notes (Signed)
Daily Session Note  Patient Details  Name: Jacob Gilmore MRN: [id number] Date of Birth: 04-Dec-1970 Referring Provider:   April Manson Pulmonary Rehab Walk Test from 11/22/2020 in [place]  [facility]      Encounter Date: 01/06/2021  Check In:  Session Check In - 01/06/21 1424      Check-In   Supervising physician immediately available to respond to emergencies [facility] immediately available    Physician(s) Dr. Tawanna Solo    [facility]    Staff Present Rosebud Poles, RN, Isaac Laud, MS, ACSM-CEP, Exercise Physiologist;Surina Storts Ysidro Evert, RN    Virtual Visit No    Medication changes reported     No    Fall or balance concerns reported    No    Tobacco Cessation No Change    Warm-up and Cool-down Performed on first and last piece of equipment    Resistance Training Performed Yes    VAD Patient? No    PAD/SET Patient? No      Pain Assessment   Currently in Pain? No/denies    Multiple Pain Sites No           Capillary Blood Glucose: No results found for this or any previous visit (from the past 24 hour(s)).    Social History   Tobacco Use  Smoking Status Former Smoker  . Types: Cigarettes  . Quit date: 12/01/2013  . Years since quitting: 7.1  Smokeless Tobacco Never Used    Goals Met:  Exercise tolerated well No report of cardiac concerns or symptoms Strength training completed today  Goals Unmet:  Not Applicable  Comments: Service time is from 1330 to 1432    Dr. Fransico Him is Medical Director for Cardiac Rehab at [facility].

## 2021-01-11 ENCOUNTER — Encounter (HOSPITAL_COMMUNITY): Payer: No Typology Code available for payment source

## 2021-01-11 NOTE — Progress Notes (Signed)
Pulmonary Individual Treatment Plan  Patient Details  Name: Jerimey Burridge MRN: [id number] Date of Birth: 1970/12/09 Referring Provider:   April Manson Pulmonary Rehab Walk Test from 11/22/2020 in [place]  Referring Provider [place]      Initial Encounter Date:  Flowsheet Row Pulmonary Rehab Walk Test from 11/22/2020 in [facility]  Date 11/22/20      Visit Diagnosis: Pulmonary emphysema, unspecified emphysema type ([place])  Patient's Home Medications on Admission:   Current Outpatient Medications:  .  albuterol (VENTOLIN HFA) 108 (90 Base) MCG/ACT inhaler, Inhale 2 puffs into the lungs as needed., Disp: , Rfl:  .  ARTIFICIAL SALIVA MT, Take 4 sprays by mouth at bedtime as needed., Disp: , Rfl:  .  diphenhydrAMINE (BENADRYL) 25 mg capsule, Take 25 mg by mouth 4 (four) times daily as needed., Disp: , Rfl:  .  doxepin (SINEQUAN) 10 MG capsule, Take 10 mg by mouth at bedtime., Disp: , Rfl:  .  insulin aspart protamine - aspart (NOVOLOG MIX 70/30 FLEXPEN) (70-30) 100 UNIT/ML FlexPen, Inject 30 Units into the skin 2 (two) times daily before a meal., Disp: , Rfl:  .  mometasone (ASMANEX) 220 MCG/INH inhaler, Inhale 2 puffs into the lungs at bedtime., Disp: , Rfl:  .  Semaglutide,0.25 or 0.5MG/DOS, 2 MG/1.5ML SOPN, Inject 0.5 mg into the skin once a week., Disp: , Rfl:  .  sildenafil (VIAGRA) 100 MG tablet, Take 50 mg by mouth as needed., Disp: , Rfl:  .  tamsulosin (FLOMAX) 0.4 MG CAPS capsule, Take 1 capsule by mouth at bedtime., Disp: , Rfl:  .  Tiotropium Bromide-Olodaterol 2.5-2.5 MCG/ACT AERS, INHALE 2 PUFFS BY MOUTH IN THE MORNING, Disp: , Rfl:  .  trospium (SANCTURA) 20 MG tablet, Take 20 mg by mouth 2 (two) times daily. Do not take with oxybutynin, Disp: , Rfl:   Past Medical History: Past Medical History:  Diagnosis Date  . Alcoholism ([place])   . Anxiety   . Asthma   . Atrial fibrillation ([place])   . Colon polyps   . COPD  (chronic obstructive pulmonary disease) ([place])   . Diabetes mellitus without complication ([place])   . Kidney stone   . Pneumonia   . Pulmonary sarcoidosis ([place])   . Sarcoidosis   . Sleep apnea    CPAP    Tobacco Use: Social History   Tobacco Use  Smoking Status Former Smoker  . Types: Cigarettes  . Quit date: 12/01/2013  . Years since quitting: 7.1  Smokeless Tobacco Never Used    Labs: Recent Review Flowsheet Data   There is no flowsheet data to display.     Capillary Blood Glucose: Lab Results  Component Value Date   GLUCAP 186 (H) 11/30/2020    POCT Glucose    Row Name 12/07/20 1507 12/21/20 1526           POCT Blood Glucose   Pre-Exercise 186 mg/dL --      Post-Exercise 151 mg/dL --      Pre-Exercise #2 -- 150 mg/dL      Post-Exercise #2 -- 111 mg/dL             Pulmonary Assessment Scores:  Pulmonary Assessment Scores    Row Name 11/22/20 1001 11/22/20 1056       ADL UCSD   ADL Phase Entry --    SOB Score total 46 --         CAT Score  CAT Score 30 --         mMRC Score   mMRC Score -- 4          UCSD: Self-administered rating of dyspnea associated with activities of daily living (ADLs) 6-point scale (0 = "not at all" to 5 = "maximal or unable to do because of breathlessness")  Scoring Scores range from 0 to 120.  Minimally important difference is 5 units  CAT: CAT can identify the health impairment of COPD patients and is better correlated with disease progression.  CAT has a scoring range of zero to 40. The CAT score is classified into four groups of low (less than 10), medium (10 - 20), high (21-30) and very high (31-40) based on the impact level of disease on health status. A CAT score over 10 suggests significant symptoms.  A worsening CAT score could be explained by an exacerbation, poor medication adherence, poor inhaler technique, or progression of COPD or comorbid conditions.  CAT MCID is 2 points  mMRC: mMRC (Modified Medical  Research Council) Dyspnea Scale is used to assess the degree of baseline functional disability in patients of respiratory disease due to dyspnea. No minimal important difference is established. A decrease in score of 1 point or greater is considered a positive change.   Pulmonary Function Assessment:  Pulmonary Function Assessment - 11/22/20 0958      Breath   Shortness of Breath Yes;Fear of Shortness of Breath;Limiting activity;Panic with Shortness of Breath           Exercise Target Goals: Exercise Program Goal: Individual exercise prescription set using results from initial 6 min walk test and THRR while considering  patient's activity barriers and safety.   Exercise Prescription Goal: Initial exercise prescription builds to 30-45 minutes a day of aerobic activity, 2-3 days per week.  Home exercise guidelines will be given to patient during program as part of exercise prescription that the participant will acknowledge.  Activity Barriers & Risk Stratification:  Activity Barriers & Cardiac Risk Stratification - 11/22/20 0955      Activity Barriers & Cardiac Risk Stratification   Activity Barriers Deconditioning;Muscular Weakness;Shortness of Breath           6 Minute Walk:  6 Minute Walk    Row Name 11/22/20 1041         6 Minute Walk   Phase Initial     Distance 1016 feet     Walk Time 6 minutes     # of Rest Breaks 0     MPH 1.92     METS 3.63     RPE 13     Perceived Dyspnea  2     VO2 Peak 12.69     Symptoms No     Resting HR 93 bpm     Resting BP 110/86     Resting Oxygen Saturation  98 %     Exercise Oxygen Saturation  during 6 min walk 86 %     Max Ex. HR 101 bpm     Max Ex. BP 120/90     2 Minute Post BP 108/88           Interval HR   1 Minute HR 101     2 Minute HR 93     3 Minute HR 91     4 Minute HR 94     5 Minute HR 95     6 Minute HR 100     2 Minute  Post HR 90     Interval Heart Rate? Yes           Interval Oxygen   Interval  Oxygen? Yes     Baseline Oxygen Saturation % 98 %     1 Minute Oxygen Saturation % 95 %     1 Minute Liters of Oxygen 0 L     2 Minute Oxygen Saturation % 95 %     2 Minute Liters of Oxygen 0 L     3 Minute Oxygen Saturation % 95 %     3 Minute Liters of Oxygen 0 L     4 Minute Oxygen Saturation % 93 %     4 Minute Liters of Oxygen 0 L     5 Minute Oxygen Saturation % 90 %     5 Minute Liters of Oxygen 0 L     6 Minute Oxygen Saturation % 86 %     6 Minute Liters of Oxygen 0 L     2 Minute Post Oxygen Saturation % 92 %     2 Minute Post Liters of Oxygen 0 L            Oxygen Initial Assessment:  Oxygen Initial Assessment - 11/22/20 1039      Home Oxygen   Home Oxygen Device None    Sleep Oxygen Prescription None    Home Exercise Oxygen Prescription None    Home Resting Oxygen Prescription None      Initial 6 min Walk   Oxygen Used None      Program Oxygen Prescription   Program Oxygen Prescription None      Intervention   Short Term Goals To learn and understand importance of monitoring SPO2 with pulse oximeter and demonstrate accurate use of the pulse oximeter.;To learn and understand importance of maintaining oxygen saturations>88%;To learn and demonstrate proper pursed lip breathing techniques or other breathing techniques.;To learn and demonstrate proper use of respiratory medications    Long  Term Goals Verbalizes importance of monitoring SPO2 with pulse oximeter and return demonstration;Maintenance of O2 saturations>88%;Exhibits proper breathing techniques, such as pursed lip breathing or other method taught during program session;Compliance with respiratory medication;Demonstrates proper use of MDI's           Oxygen Re-Evaluation:  Oxygen Re-Evaluation    Row Name 12/13/20 1631 01/11/21 1311           Program Oxygen Prescription   Program Oxygen Prescription None None             Home Oxygen   Home Oxygen Device None None      Sleep Oxygen  Prescription None None      Home Exercise Oxygen Prescription None None      Home Resting Oxygen Prescription None None             Goals/Expected Outcomes   Short Term Goals To learn and understand importance of monitoring SPO2 with pulse oximeter and demonstrate accurate use of the pulse oximeter.;To learn and understand importance of maintaining oxygen saturations>88%;To learn and demonstrate proper pursed lip breathing techniques or other breathing techniques.;To learn and demonstrate proper use of respiratory medications To learn and understand importance of monitoring SPO2 with pulse oximeter and demonstrate accurate use of the pulse oximeter.;To learn and understand importance of maintaining oxygen saturations>88%;To learn and demonstrate proper pursed lip breathing techniques or other breathing techniques.;To learn and demonstrate proper use of respiratory medications      Long  Term  Goals Verbalizes importance of monitoring SPO2 with pulse oximeter and return demonstration;Maintenance of O2 saturations>88%;Exhibits proper breathing techniques, such as pursed lip breathing or other method taught during program session;Compliance with respiratory medication;Demonstrates proper use of MDI's Verbalizes importance of monitoring SPO2 with pulse oximeter and return demonstration;Maintenance of O2 saturations>88%;Exhibits proper breathing techniques, such as pursed lip breathing or other method taught during program session;Compliance with respiratory medication;Demonstrates proper use of MDI's      Goals/Expected Outcomes compliance and understanding of oxygen saturation and pursed lip breathing. Compliance with monitoring oxygen saturation at home. compliance and understanding of oxygen saturation and pursed lip breathing. Compliance with monitoring oxygen saturation at home.             Oxygen Discharge (Final Oxygen Re-Evaluation):  Oxygen Re-Evaluation - 01/11/21 1311      Program Oxygen  Prescription   Program Oxygen Prescription None      Home Oxygen   Home Oxygen Device None    Sleep Oxygen Prescription None    Home Exercise Oxygen Prescription None    Home Resting Oxygen Prescription None      Goals/Expected Outcomes   Short Term Goals To learn and understand importance of monitoring SPO2 with pulse oximeter and demonstrate accurate use of the pulse oximeter.;To learn and understand importance of maintaining oxygen saturations>88%;To learn and demonstrate proper pursed lip breathing techniques or other breathing techniques.;To learn and demonstrate proper use of respiratory medications    Long  Term Goals Verbalizes importance of monitoring SPO2 with pulse oximeter and return demonstration;Maintenance of O2 saturations>88%;Exhibits proper breathing techniques, such as pursed lip breathing or other method taught during program session;Compliance with respiratory medication;Demonstrates proper use of MDI's    Goals/Expected Outcomes compliance and understanding of oxygen saturation and pursed lip breathing. Compliance with monitoring oxygen saturation at home.           Initial Exercise Prescription:  Initial Exercise Prescription - 11/22/20 1000      Date of Initial Exercise RX and Referring Provider   Date 11/22/20    Referring Provider [facility]    Expected Discharge Date 01/27/21      Treadmill   MPH 1.8    Grade 0    Minutes 15      NuStep   Level 2    SPM 80    Minutes 15      Prescription Details   Frequency (times per week) 2    Duration Progress to 45 minutes of aerobic exercise without signs/symptoms of physical distress      Intensity   THRR 40-80% of Max Heartrate 68-136    Ratings of Perceived Exertion 11-13    Perceived Dyspnea 0-4      Progression   Progression Continue to progress workloads to maintain intensity without signs/symptoms of physical distress.      Resistance Training   Training Prescription Yes    Weight blue bands    Reps  10-15           Perform Capillary Blood Glucose checks as needed.  Exercise Prescription Changes:  Exercise Prescription Changes    Row Name 12/07/20 1500 12/13/20 1600 12/16/20 1500 12/16/20 1531 12/30/20 1515     Response to Exercise   Blood Pressure (Admit) 128/87 -- -- 108/80 128/82   Blood Pressure (Exercise) 150/80 -- -- -- --   Blood Pressure (Exit) 118/86 -- -- 128/86 124/78   Heart Rate (Admit) 100 bpm -- -- 90 bpm 91 bpm   Heart Rate (Exercise)  113 bpm -- -- 108 bpm 113 bpm   Heart Rate (Exit) 95 bpm -- -- 96 bpm 97 bpm   Oxygen Saturation (Admit) 99 % -- -- 95 % 97 %   Oxygen Saturation (Exercise) 93 % -- -- 96 % 89 %   Oxygen Saturation (Exit) 98 % -- -- 97 % 96 %   Rating of Perceived Exertion (Exercise) 15 -- -- 11 11   Perceived Dyspnea (Exercise) 2.5 -- -- 1 1   Duration Continue with 30 min of aerobic exercise without signs/symptoms of physical distress. -- -- Continue with 30 min of aerobic exercise without signs/symptoms of physical distress. Continue with 30 min of aerobic exercise without signs/symptoms of physical distress.   Intensity THRR unchanged -- -- Other (comment)  40-80% HR Max THRR unchanged     Progression   Progression Continue to progress workloads to maintain intensity without signs/symptoms of physical distress. -- -- Continue to progress workloads to maintain intensity without signs/symptoms of physical distress. Continue to progress workloads to maintain intensity without signs/symptoms of physical distress.   Average METs -- 2.05 -- -- --     Horticulturist, commercial Prescription Yes -- -- Yes Yes   Weight blue bands -- -- Blue bands blue bands   Reps 10-15 -- -- 10-15 10-15   Time 10 Minutes -- -- 10 Minutes 10 Minutes     NuStep   Level 2 -- -- 2 2   SPM 80 -- -- 80 80   Minutes 15 -- -- 15 15   METs 1.9 -- -- 2 2.2     Track   Laps -- -- -- 13 16   Minutes -- -- -- 15 15   METs -- -- -- 2.51 --     Home Exercise Plan    Plans to continue exercise at -- -- Home (comment) -- --   Frequency -- -- Add 2 additional days to program exercise sessions. -- --   Initial Home Exercises Provided -- -- 12/16/20 -- --          Exercise Comments:  Exercise Comments    Row Name 11/30/20 1505 12/16/20 1528         Exercise Comments Patient completed first day of exercise and tolerated well with no complaints or concerns. Pt was able to do 15 minutes on the Nustep with no breaks. He also walked for 15 minutes and had to take a couple of rest breaks due to SOB. Pre CBG 224, post CBG 186. Completed home exercise with pt. He is not currently exercising at home. We discussed adding 2 additional days of exercise at home and patient was receptive. Discussed starting walking and resistance training for 30-45 minutes .             Exercise Goals and Review:  Exercise Goals    Row Name 11/22/20 1037             Exercise Goals   Increase Physical Activity Yes       Intervention Provide advice, education, support and counseling about physical activity/exercise needs.;Develop an individualized exercise prescription for aerobic and resistive training based on initial evaluation findings, risk stratification, comorbidities and participant's personal goals.       Expected Outcomes Short Term: Attend rehab on a regular basis to increase amount of physical activity.;Long Term: Add in home exercise to make exercise part of routine and to increase amount of physical activity.;Long Term: Exercising regularly at least  3-5 days a week.       Increase Strength and Stamina Yes       Intervention Provide advice, education, support and counseling about physical activity/exercise needs.;Develop an individualized exercise prescription for aerobic and resistive training based on initial evaluation findings, risk stratification, comorbidities and participant's personal goals.       Expected Outcomes Short Term: Increase workloads from initial  exercise prescription for resistance, speed, and METs.;Short Term: Perform resistance training exercises routinely during rehab and add in resistance training at home;Long Term: Improve cardiorespiratory fitness, muscular endurance and strength as measured by increased METs and functional capacity (6MWT)       Able to understand and use rate of perceived exertion (RPE) scale Yes       Intervention Provide education and explanation on how to use RPE scale       Expected Outcomes Short Term: Able to use RPE daily in rehab to express subjective intensity level;Long Term:  Able to use RPE to guide intensity level when exercising independently       Able to understand and use Dyspnea scale Yes       Intervention Provide education and explanation on how to use Dyspnea scale       Expected Outcomes Short Term: Able to use Dyspnea scale daily in rehab to express subjective sense of shortness of breath during exertion;Long Term: Able to use Dyspnea scale to guide intensity level when exercising independently       Knowledge and understanding of Target Heart Rate Range (THRR) Yes       Intervention Provide education and explanation of THRR including how the numbers were predicted and where they are located for reference       Expected Outcomes Short Term: Able to state/look up THRR;Long Term: Able to use THRR to govern intensity when exercising independently;Short Term: Able to use daily as guideline for intensity in rehab       Understanding of Exercise Prescription Yes       Intervention Provide education, explanation, and written materials on patient's individual exercise prescription       Expected Outcomes Short Term: Able to explain program exercise prescription;Long Term: Able to explain home exercise prescription to exercise independently              Exercise Goals Re-Evaluation :  Exercise Goals Re-Evaluation    Row Name 12/13/20 1628 01/11/21 1307           Exercise Goal Re-Evaluation    Exercise Goals Review Increase Physical Activity;Increase Strength and Stamina;Able to understand and use rate of perceived exertion (RPE) scale;Able to understand and use Dyspnea scale;Knowledge and understanding of Target Heart Rate Range (THRR);Understanding of Exercise Prescription Increase Physical Activity;Increase Strength and Stamina;Able to understand and use rate of perceived exertion (RPE) scale;Able to understand and use Dyspnea scale;Knowledge and understanding of Target Heart Rate Range (THRR);Understanding of Exercise Prescription      Comments Patient has completed 2 exercise sessions and has tolerated well so far. It is too early to see any progression, but we will continue to monitor and progress as he is able. He is beginning to be independent with the equipment and resistance bands. He is able to do all exercises with no complaints. He is exercising at 1.9 METS on the Nustep and 2.05 METS walking the track. [place] has completed 6 exercise sessions and has been a little slow to make progressions with workload and MET increases, most likely due to irregular attendance. He  has only completed 6 exercise sessions in 6 weeks. Pt is exercising at 2.5 METS on the Nustep and 2.51 METS walking the track. He is independent with all resistance training exercises and stretches. Will continue to monitor and progress as he is able.      Expected Outcomes Through exercise at rehab and home the patient will decrease shortness of breath with daily activities and feel confident in carrying out an exercise regimn at home. Through exercise at rehab and home the patient will decrease shortness of breath with daily activities and feel confident in carrying out an exercise regimn at home.             Discharge Exercise Prescription (Final Exercise Prescription Changes):  Exercise Prescription Changes - 12/30/20 1515      Response to Exercise   Blood Pressure (Admit) 128/82    Blood Pressure (Exit) 124/78     Heart Rate (Admit) 91 bpm    Heart Rate (Exercise) 113 bpm    Heart Rate (Exit) 97 bpm    Oxygen Saturation (Admit) 97 %    Oxygen Saturation (Exercise) 89 %    Oxygen Saturation (Exit) 96 %    Rating of Perceived Exertion (Exercise) 11    Perceived Dyspnea (Exercise) 1    Duration Continue with 30 min of aerobic exercise without signs/symptoms of physical distress.    Intensity THRR unchanged      Progression   Progression Continue to progress workloads to maintain intensity without signs/symptoms of physical distress.      Resistance Training   Training Prescription Yes    Weight blue bands    Reps 10-15    Time 10 Minutes      NuStep   Level 2    SPM 80    Minutes 15    METs 2.2      Track   Laps 16    Minutes 15           Nutrition:  Target Goals: Understanding of nutrition guidelines, daily intake of sodium <1570m, cholesterol <2038m calories 30% from fat and 7% or less from saturated fats, daily to have 5 or more servings of fruits and vegetables.  Biometrics:  Pre Biometrics - 11/22/20 0956      Pre Biometrics   Height 6' 2"  (1.88 m)    Weight 92.4 kg    BMI (Calculated) 26.14    Grip Strength 45.5 kg            Nutrition Therapy Plan and Nutrition Goals:  Nutrition Therapy & Goals - 01/06/21 1437      Nutrition Therapy   Diet Carb modified      Personal Nutrition Goals   Nutrition Goal CBG concentrations in the normal range or as close to normal as is safely possible.    Personal Goal #2 Improved blood glucose control as evidenced by pt's A1c trending from 10.9 toward less than 7.0.      Intervention Plan   Intervention Prescribe, educate and counsel regarding individualized specific dietary modifications aiming towards targeted core components such as weight, hypertension, lipid management, diabetes, heart failure and other comorbidities.    Expected Outcomes Short Term Goal: Understand basic principles of dietary content, such as calories,  fat, sodium, cholesterol and nutrients.           Nutrition Assessments:  MEDIFICTS Score Key:  ?70 Need to make dietary changes   40-70 Heart Healthy Diet  ? 40 Therapeutic Level Cholesterol Diet  Picture Your Plate Scores:  <28 Unhealthy dietary pattern with much room for improvement.  41-50 Dietary pattern unlikely to meet recommendations for good health and room for improvement.  51-60 More healthful dietary pattern, with some room for improvement.   >60 Healthy dietary pattern, although there may be some specific behaviors that could be improved.    Nutrition Goals Re-Evaluation:  Nutrition Goals Re-Evaluation    [place] Name 01/06/21 1437             Goals   Current Weight 203 lb (92.1 kg)              Nutrition Goals Discharge (Final Nutrition Goals Re-Evaluation):  Nutrition Goals Re-Evaluation - 01/06/21 1437      Goals   Current Weight 203 lb (92.1 kg)           Psychosocial: Target Goals: Acknowledge presence or absence of significant depression and/or stress, maximize coping skills, provide positive support system. Participant is able to verbalize types and ability to use techniques and skills needed for reducing stress and depression.  Initial Review & Psychosocial Screening:  Initial Psych Review & Screening - 11/22/20 1001      Initial Review   Current issues with None Identified      Family Dynamics   Good Support System? Yes      Barriers   Psychosocial barriers to participate in program There are no identifiable barriers or psychosocial needs.      Screening Interventions   Interventions Encouraged to exercise           Quality of Life Scores:  Scores of 19 and below usually indicate a poorer quality of life in these areas.  A difference of  2-3 points is a clinically meaningful difference.  A difference of 2-3 points in the total score of the Quality of Life Index has been associated with significant improvement in overall  quality of life, self-image, physical symptoms, and general health in studies assessing change in quality of life.  PHQ-9: Recent Review Flowsheet Data    Depression screen [facility] 2/9 11/22/2020   Decreased Interest 0   Down, Depressed, Hopeless 0   PHQ - 2 Score 0   Altered sleeping 0   Tired, decreased energy 2   Change in appetite 0   Feeling bad or failure about yourself  0   Trouble concentrating 0   Moving slowly or fidgety/restless 0   Suicidal thoughts 0   Difficult doing work/chores Not difficult at all     Interpretation of Total Score  Total Score Depression Severity:  1-4 = Minimal depression, 5-9 = Mild depression, 10-14 = Moderate depression, 15-19 = Moderately severe depression, 20-27 = Severe depression   Psychosocial Evaluation and Intervention:  Psychosocial Evaluation - 11/22/20 1002      Psychosocial Evaluation & Interventions   Interventions Encouraged to exercise with the program and follow exercise prescription    Comments No barriers identified    Continue Psychosocial Services  No Follow up required           Psychosocial Re-Evaluation:  Psychosocial Re-Evaluation    [place] Name 12/13/20 1345 01/10/21 1336           Psychosocial Re-Evaluation   Current issues with None Identified None Identified      Comments No concerns identified. No psychosocial concerns identified at this time.      Expected Outcomes -- For Younes to continue to be free of psychsocial concerns while participating in pulmonary  rehab.      Interventions Encouraged to attend Pulmonary Rehabilitation for the exercise Encouraged to attend Pulmonary Rehabilitation for the exercise      Continue Psychosocial Services  No Follow up required No Follow up required             Psychosocial Discharge (Final Psychosocial Re-Evaluation):  Psychosocial Re-Evaluation - 01/10/21 1336      Psychosocial Re-Evaluation   Current issues with None Identified    Comments No psychosocial concerns  identified at this time.    Expected Outcomes For Hasnain to continue to be free of psychsocial concerns while participating in pulmonary rehab.    Interventions Encouraged to attend Pulmonary Rehabilitation for the exercise    Continue Psychosocial Services  No Follow up required           Education: Education Goals: Education classes will be provided on a weekly basis, covering required topics. Participant will state understanding/return demonstration of topics presented.  Learning Barriers/Preferences:  Learning Barriers/Preferences - 11/22/20 1002      Learning Barriers/Preferences   Learning Barriers None    Learning Preferences Computer/Internet           Education Topics: Risk Factor Reduction:  -Group instruction that is supported by a PowerPoint presentation. Instructor discusses the definition of a risk factor, different risk factors for pulmonary disease, and how the heart and lungs work together.     Nutrition for Pulmonary Patient:  -Group instruction provided by PowerPoint slides, verbal discussion, and written materials to support subject matter. The instructor gives an explanation and review of healthy diet recommendations, which includes a discussion on weight management, recommendations for fruit and vegetable consumption, as well as protein, fluid, caffeine, fiber, sodium, sugar, and alcohol. Tips for eating when patients are short of breath are discussed.   Pursed Lip Breathing:  -Group instruction that is supported by demonstration and informational handouts. Instructor discusses the benefits of pursed lip and diaphragmatic breathing and detailed demonstration on how to preform both.     Oxygen Safety:  -Group instruction provided by PowerPoint, verbal discussion, and written material to support subject matter. There is an overview of "What is Oxygen" and "Why do we need it".  Instructor also reviews how to create a safe environment for oxygen use, the  importance of using oxygen as prescribed, and the risks of noncompliance. There is a brief discussion on traveling with oxygen and resources the patient may utilize. Flowsheet Row PULMONARY REHAB OTHER RESPIRATORY from 12/16/2020 in [place]  Date 12/16/20  Educator Handout  Instruction Review Code 1- Verbalizes Understanding      Oxygen Equipment:  -Group instruction provided by [employer] Staff utilizing handouts, written materials, and Insurance underwriter.   Signs and Symptoms:  -Group instruction provided by written material and verbal discussion to support subject matter. Warning signs and symptoms of infection, stroke, and heart attack are reviewed and when to call the physician/911 reinforced. Tips for preventing the spread of infection discussed.   Advanced Directives:  -Group instruction provided by verbal instruction and written material to support subject matter. Instructor reviews Advanced Directive laws and proper instruction for filling out document.   Pulmonary Video:  -Group video education that reviews the importance of medication and oxygen compliance, exercise, good nutrition, pulmonary hygiene, and pursed lip and diaphragmatic breathing for the pulmonary patient.   Exercise for the Pulmonary Patient:  -Group instruction that is supported by a PowerPoint presentation. Instructor discusses benefits of exercise, core  components of exercise, frequency, duration, and intensity of an exercise routine, importance of utilizing pulse oximetry during exercise, safety while exercising, and options of places to exercise outside of rehab.     Pulmonary Medications:  -Verbally interactive group education provided by instructor with focus on inhaled medications and proper administration.   Anatomy and Physiology of the Respiratory System and Intimacy:  -Group instruction provided by PowerPoint, verbal discussion, and written material to  support subject matter. Instructor reviews respiratory cycle and anatomical components of the respiratory system and their functions. Instructor also reviews differences in obstructive and restrictive respiratory diseases with examples of each. Intimacy, Sex, and Sexuality differences are reviewed with a discussion on how relationships can change when diagnosed with pulmonary disease. Common sexual concerns are reviewed.   MD DAY -A group question and answer session with a medical doctor that allows participants to ask questions that relate to their pulmonary disease state.   OTHER EDUCATION -Group or individual verbal, written, or video instructions that support the educational goals of the pulmonary rehab program. [facility] from 12/16/2020 in [place]  Date 12/07/20  Educator Hand out  [facility] Plate]      Holiday Eating Survival Tips:  -Group instruction provided by PowerPoint slides, verbal discussion, and written materials to support subject matter. The instructor gives patients tips, tricks, and techniques to help them not only survive but enjoy the holidays despite the onslaught of food that accompanies the holidays.   Knowledge Questionnaire Score:  Knowledge Questionnaire Score - 11/22/20 1019      Knowledge Questionnaire Score   Pre Score 14/18           Core Components/Risk Factors/Patient Goals at Admission:  Personal Goals and Risk Factors at Admission - 11/22/20 1003      Core Components/Risk Factors/Patient Goals on Admission   Improve shortness of breath with ADL's Yes    Intervention Provide education, individualized exercise plan and daily activity instruction to help decrease symptoms of SOB with activities of daily living.    Expected Outcomes Short Term: Improve cardiorespiratory fitness to achieve a reduction of symptoms when performing ADLs;Long Term: Be able to perform more ADLs without  symptoms or delay the onset of symptoms           Core Components/Risk Factors/Patient Goals Review:   Goals and Risk Factor Review    Row Name 11/22/20 1003 12/13/20 1345 01/10/21 1337         Core Components/Risk Factors/Patient Goals Review   Personal Goals Review Develop more efficient breathing techniques such as purse lipped breathing and diaphragmatic breathing and practicing self-pacing with activity.;Increase knowledge of respiratory medications and ability to use respiratory devices properly.;Improve shortness of breath with ADL's Develop more efficient breathing techniques such as purse lipped breathing and diaphragmatic breathing and practicing self-pacing with activity.;Increase knowledge of respiratory medications and ability to use respiratory devices properly.;Improve shortness of breath with ADL's Develop more efficient breathing techniques such as purse lipped breathing and diaphragmatic breathing and practicing self-pacing with activity.;Increase knowledge of respiratory medications and ability to use respiratory devices properly.;Improve shortness of breath with ADL's     Review -- Has attended 2 exercise sessions, too early to have met any program goals. Jamarious has been absent many times, he states he feels stronger performing activites at home and has started exercising at home on the days he is not in pulmonary rehab.  He is exercising @ 2.5 mets on the  nustep and walking 16 laps on the track in 15 minutes.     Expected Outcomes -- See admission goals. See admission goals.            Core Components/Risk Factors/Patient Goals at Discharge (Final Review):   Goals and Risk Factor Review - 01/10/21 1337      Core Components/Risk Factors/Patient Goals Review   Personal Goals Review Develop more efficient breathing techniques such as purse lipped breathing and diaphragmatic breathing and practicing self-pacing with activity.;Increase knowledge of respiratory medications and  ability to use respiratory devices properly.;Improve shortness of breath with ADL's    Review Jewett has been absent many times, he states he feels stronger performing activites at home and has started exercising at home on the days he is not in pulmonary rehab.  He is exercising @ 2.5 mets on the nustep and walking 16 laps on the track in 15 minutes.    Expected Outcomes See admission goals.           ITP Comments:   Comments:

## 2021-01-13 ENCOUNTER — Encounter (HOSPITAL_COMMUNITY): Payer: No Typology Code available for payment source

## 2021-01-18 ENCOUNTER — Other Ambulatory Visit: Payer: Self-pay

## 2021-01-18 ENCOUNTER — Encounter (HOSPITAL_COMMUNITY)
Admission: RE | Admit: 2021-01-18 | Discharge: 2021-01-18 | Disposition: A | Discharge status: Home / Self Care | Payer: No Typology Code available for payment source | Source: Ambulatory Visit | Attending: Cardiology | Admitting: Cardiology

## 2021-01-18 VITALS — Wt 199.3 lb

## 2021-01-18 DIAGNOSIS — J439 Emphysema, unspecified: Secondary | ICD-10-CM | POA: Diagnosis present

## 2021-01-18 NOTE — Progress Notes (Signed)
Daily Session Note  Patient Details  Name: Jacob Gilmore MRN: [id number] Date of Birth: August 17, 1971 Referring Provider:   April Manson Pulmonary Rehab Walk Test from 11/22/2020 in [place]  [facility]      Encounter Date: 01/18/2021  Check In:  Session Check In - 01/18/21 1432      Check-In   Supervising physician immediately available to respond to emergencies [facility] immediately available    Physician(s) Dr. Tawanna Solo    Location [facility]    Staff Present Maurice Small, RN, BSN;Nathalya Wolanski Ysidro Evert, RN;Jessica Hassell Done, MS, ACSM-CEP, Exercise Physiologist    Virtual Visit No    Medication changes reported     No    Fall or balance concerns reported    No    Tobacco Cessation No Change    Warm-up and Cool-down Performed on first and last piece of equipment    Resistance Training Performed Yes    VAD Patient? No    PAD/SET Patient? No      Pain Assessment   Currently in Pain? No/denies    Multiple Pain Sites No           Capillary Blood Glucose: No results found for this or any previous visit (from the past 24 hour(s)).  POCT Glucose - 01/18/21 1535      POCT Blood Glucose   Pre-Exercise 176 mg/dL    Post-Exercise 127 mg/dL           Exercise Prescription Changes - 01/18/21 1500      Response to Exercise   Blood Pressure (Admit) 120/78    Blood Pressure (Exercise) 134/80    Blood Pressure (Exit) 120/80    Heart Rate (Admit) 103 bpm    Heart Rate (Exercise) 121 bpm    Heart Rate (Exit) 106 bpm    Oxygen Saturation (Admit) 93 %    Oxygen Saturation (Exercise) 92 %    Oxygen Saturation (Exit) 97 %    Rating of Perceived Exertion (Exercise) 11    Perceived Dyspnea (Exercise) 2    Duration Continue with 30 min of aerobic exercise without signs/symptoms of physical distress.    Intensity THRR unchanged      Progression   Progression Continue to progress workloads to maintain intensity without  signs/symptoms of physical distress.      Resistance Training   Training Prescription Yes    Reps 10-15    Time 10 Minutes      Treadmill   MPH 2    Grade 0    Minutes 15      NuStep   Level 3    SPM 80    Minutes 15    METs 2.5           Social History   Tobacco Use  Smoking Status Former Smoker  . Types: Cigarettes  . Quit date: 12/01/2013  . Years since quitting: 7.1  Smokeless Tobacco Never Used    Goals Met:  Exercise tolerated well No report of cardiac concerns or symptoms Strength training completed today  Goals Unmet:  Not Applicable  Comments: Service time is from 1330 to 1425    Dr. Fransico Him is Medical Director for Cardiac Rehab at [facility].

## 2021-01-20 ENCOUNTER — Encounter (HOSPITAL_COMMUNITY)
Admission: RE | Admit: 2021-01-20 | Discharge: 2021-01-20 | Disposition: A | Discharge status: Home / Self Care | Payer: No Typology Code available for payment source | Source: Ambulatory Visit | Attending: Cardiology | Admitting: Cardiology

## 2021-01-20 ENCOUNTER — Other Ambulatory Visit: Payer: Self-pay

## 2021-01-20 DIAGNOSIS — J439 Emphysema, unspecified: Secondary | ICD-10-CM

## 2021-01-20 NOTE — Progress Notes (Signed)
Daily Session Note  Patient Details  Name: Jacob Gilmore MRN: [id number] Date of Birth: [birth year] Referring Provider:   April Manson Pulmonary Rehab Walk Test from 11/22/2020 in [place]  [facility]      Encounter Date: 01/20/2021  Check In:  Session Check In - 01/20/21 1429      Check-In   Supervising physician immediately available to respond to emergencies [facility] immediately available    Physician(s) Dr. Pricilla Loveless    [facility]    Staff Present Rosebud Poles, RN, BSN;Bartlett Enke Ysidro Evert, RN;Jessica Hassell Done, MS, ACSM-CEP, Exercise Physiologist    Virtual Visit No    Medication changes reported     No    Fall or balance concerns reported    No    Tobacco Cessation No Change    Warm-up and Cool-down Performed on first and last piece of equipment    Resistance Training Performed Yes    VAD Patient? No    PAD/SET Patient? No      Pain Assessment   Currently in Pain? No/denies    Multiple Pain Sites No           Capillary Blood Glucose: No results found for this or any previous visit (from the past 24 hour(s)).    Social History   Tobacco Use  Smoking Status Former Smoker  . Types: Cigarettes  . Quit date: 12/01/2013  . Years since quitting: 7.1  Smokeless Tobacco Never Used    Goals Met:  Exercise tolerated well No report of cardiac concerns or symptoms Strength training completed today  Goals Unmet:  Not Applicable  Comments: Service time is from 1330 to 1420    Dr. Fransico Him is Medical Director for Cardiac Rehab at [facility].

## 2021-01-21 NOTE — Progress Notes (Signed)
Nutrition Note  Pt has lost 12 lbs in past 2 months since starting Ozempic. He states no other changes to diet or exercise habits. Exercising 3 days a week. Trying to make diet choices for glycemic management.  His CBGs are 100-170 mg/dl.  He checks BID.  He thinks his MD plans to increase Ozempic to 1.0 mg soon.   Will continue to monitor pt during pulmonary rehab.     Nutrition Diagnosis   Food-and nutrition-related knowledge deficit related to lack of exposure to information as related to diagnosis of: ? CVD ? Type 2 Diabetes  Nutrition Intervention   Pt's individual nutrition plan reviewed with pt.  Benefits of adopting healthy diet reviewed with Rate My Plate survey  Continue client-centered nutrition education by RD, as part of interdisciplinary care.  Goal(s)  CBG concentrations in the normal range or as close to normal as is safely possible.  Improved blood glucose control as evidenced by pt's A1c trending from 10.9 toward less than 7.0.  Plan:  Will provide client-centered nutrition education as part of interdisciplinary care  Monitor and evaluate progress toward nutrition goal with team.   Andrey Campanile, MS, RDN, LDN

## 2021-01-25 ENCOUNTER — Encounter (HOSPITAL_COMMUNITY)
Admission: RE | Admit: 2021-01-25 | Discharge: 2021-01-25 | Disposition: A | Discharge status: Home / Self Care | Payer: No Typology Code available for payment source | Source: Ambulatory Visit | Attending: Cardiology | Admitting: Cardiology

## 2021-01-25 ENCOUNTER — Other Ambulatory Visit: Payer: Self-pay

## 2021-01-25 DIAGNOSIS — J439 Emphysema, unspecified: Secondary | ICD-10-CM | POA: Diagnosis not present

## 2021-01-25 NOTE — Progress Notes (Signed)
Daily Session Note  Patient Details  Name: Jacob Gilmore MRN: [id number] Date of Birth: May 22, 1971 Referring Provider:   April Manson Pulmonary Rehab Walk Test from 11/22/2020 in [place]  [facility]      Encounter Date: 01/25/2021  Check In:  Session Check In - 01/25/21 1429      Check-In   Supervising physician immediately available to respond to emergencies [facility] immediately available    Physician(s) Dr. Tawanna Solo    [facility]    Staff Present Rosebud Poles, RN, Milus Glazier, MS, EP-C, CCRP;Jazariah Teall Hassell Done, MS, ACSM-CEP, Exercise Physiologist    Virtual Visit No    Medication changes reported     No    Fall or balance concerns reported    No    Tobacco Cessation No Change    Warm-up and Cool-down Performed on first and last piece of equipment    Resistance Training Performed Yes    VAD Patient? No    PAD/SET Patient? No      Pain Assessment   Currently in Pain? No/denies    Multiple Pain Sites No           Capillary Blood Glucose: No results found for this or any previous visit (from the past 24 hour(s)).    Social History   Tobacco Use  Smoking Status Former Smoker  . Types: Cigarettes  . Quit date: 12/01/2013  . Years since quitting: 7.1  Smokeless Tobacco Never Used    Goals Met:  Proper associated with RPD/PD & O2 Sat Exercise tolerated well No report of cardiac concerns or symptoms Strength training completed today  Goals Unmet:  Not Applicable  Comments: Service time is from 1350 to 1450    Dr. Fransico Him is Medical Director for Cardiac Rehab at [facility].

## 2021-01-27 ENCOUNTER — Other Ambulatory Visit: Payer: Self-pay

## 2021-01-27 ENCOUNTER — Encounter (HOSPITAL_COMMUNITY)
Admission: RE | Admit: 2021-01-27 | Discharge: 2021-01-27 | Disposition: A | Discharge status: Home / Self Care | Payer: No Typology Code available for payment source | Source: Ambulatory Visit | Attending: Cardiology | Admitting: Cardiology

## 2021-01-27 DIAGNOSIS — J439 Emphysema, unspecified: Secondary | ICD-10-CM | POA: Diagnosis not present

## 2021-01-27 NOTE — Progress Notes (Signed)
Daily Session Note  Patient Details  Name: Jacob Gilmore MRN: [id number] Date of Birth: [birth year] Referring Provider:   April Manson Pulmonary Rehab Walk Test from 11/22/2020 in [place]  [facility]      Encounter Date: 01/27/2021  Check In:  Session Check In - 01/27/21 1356      Check-In   Supervising physician immediately available to respond to emergencies [facility] immediately available    Physician(s) Dr. Florencia Reasons    [facility]    Staff Present Rosebud Poles, RN, BSN;Lisa Ysidro Evert, RN;Samar Dass Hassell Done, MS, ACSM-CEP, Exercise Physiologist    Virtual Visit No    Medication changes reported     No    Fall or balance concerns reported    No    Tobacco Cessation No Change    Warm-up and Cool-down Performed on first and last piece of equipment    Resistance Training Performed Yes    VAD Patient? No    PAD/SET Patient? No      Pain Assessment   Currently in Pain? No/denies    Multiple Pain Sites No           Capillary Blood Glucose: No results found for this or any previous visit (from the past 24 hour(s)).    Social History   Tobacco Use  Smoking Status Former Smoker  . Types: Cigarettes  . Quit date: 12/01/2013  . Years since quitting: 7.1  Smokeless Tobacco Never Used    Goals Met:  Proper associated with RPD/PD & O2 Sat Improved SOB with ADL's Exercise tolerated well No report of cardiac concerns or symptoms Completed pulmonary undergrad program  Goals Unmet:  Not Applicable  Comments: Service time is from 1315 to 1340    Dr. Fransico Him is Medical Director for Cardiac Rehab at [facility].

## 2021-02-12 NOTE — Addendum Note (Signed)
Encounter addended by: Drema Pry, RN on: 02/12/2021 12:49 PM  Actions taken: Flowsheet accepted

## 2021-02-17 NOTE — Addendum Note (Signed)
Encounter addended by: Randell Loop, RD on: 02/17/2021 2:41 PM  Actions taken: Flowsheet data copied forward, Flowsheet accepted

## 2021-02-21 NOTE — Progress Notes (Signed)
Discharge Progress Report  Patient Details  Name: Jacob Gilmore MRN: [id number] Date of Birth: 1970-12-19 Referring Provider:   April Manson [facility] Walk Test from 11/22/2020 in [place]  Referring Provider [place]       Number of Visits: 10  Reason for Discharge:  Patient reached a stable level of exercise. Patient independent in their exercise. Patient has met program and personal goals.  Smoking History:  Social History   Tobacco Use  Smoking Status Former Smoker  . Types: Cigarettes  . Quit date: 12/01/2013  . Years since quitting: 7.2  Smokeless Tobacco Never Used    Diagnosis:  Pulmonary emphysema, unspecified emphysema type ([place])  ADL [facility]:  Pulmonary Assessment Scores    Row Name 11/22/20 1001 11/22/20 1056 01/27/21 1537     ADL [facility]   ADL Phase Entry -- Exit   SOB Score total 46 -- 56     CAT Score   CAT Score 30 -- 17     mMRC Score   mMRC Score -- 4 2   Row Name 02/12/21 1246 02/12/21 1247       ADL [facility]   ADL Phase Exit Entry    SOB Score total 56 46         CAT Score   CAT Score 17 30           Initial Exercise Prescription:  Initial Exercise Prescription - 11/22/20 1000      Date of Initial Exercise RX and Referring Provider   Date 11/22/20    Referring Provider [facility]    Expected Discharge Date 01/27/21      Treadmill   MPH 1.8    Grade 0    Minutes 15      NuStep   Level 2    SPM 80    Minutes 15      Prescription Details   Frequency (times per week) 2    Duration Progress to 45 minutes of aerobic exercise without signs/symptoms of physical distress      Intensity   THRR 40-80% of Max Heartrate 68-136    Ratings of Perceived Exertion 11-13    Perceived Dyspnea 0-4      Progression   Progression Continue to progress workloads to maintain intensity without signs/symptoms of physical distress.      Resistance Training   Training Prescription Yes    Weight blue bands    Reps 10-15            Discharge Exercise Prescription (Final Exercise Prescription Changes):  Exercise Prescription Changes - 01/18/21 1500      Response to Exercise   Blood Pressure (Admit) 120/78    Blood Pressure (Exercise) 134/80    Blood Pressure (Exit) 120/80    Heart Rate (Admit) 103 bpm    Heart Rate (Exercise) 121 bpm    Heart Rate (Exit) 106 bpm    Oxygen Saturation (Admit) 93 %    Oxygen Saturation (Exercise) 92 %    Oxygen Saturation (Exit) 97 %    Rating of Perceived Exertion (Exercise) 11    Perceived Dyspnea (Exercise) 2    Duration Continue with 30 min of aerobic exercise without signs/symptoms of physical distress.    Intensity THRR unchanged      Progression   Progression Continue to progress workloads to maintain intensity without signs/symptoms of physical distress.      Resistance Training   Training Prescription Yes    Reps 10-15  Time 10 Minutes      Treadmill   MPH 2    Grade 0    Minutes 15      NuStep   Level 3    SPM 80    Minutes 15    METs 2.5           Functional Capacity:  6 Minute Walk    Row Name 11/22/20 1041 01/27/21 1522       6 Minute Walk   Phase Initial Discharge    Distance 1016 feet 1327 feet    Distance % Change -- 30.61 %    Distance Feet Change -- 311 ft    Walk Time 6 minutes 6 minutes    # of Rest Breaks 0 0    MPH 1.92 2.51    METS 3.63 4.46    RPE 13 11    Perceived Dyspnea  2 1    VO2 Peak 12.69 15.6    Symptoms No No    Resting HR 93 bpm 100 bpm    Resting BP 110/86 114/80    Resting Oxygen Saturation  98 % 100 %    Exercise Oxygen Saturation  during 6 min walk 86 % 91 %    Max Ex. HR 101 bpm 116 bpm    Max Ex. BP 120/90 132/82    2 Minute Post BP 108/88 112/80         Interval HR   1 Minute HR 101 112    2 Minute HR 93 113    3 Minute HR 91 115    4 Minute HR 94 116    5 Minute HR 95 116    6 Minute HR 100 115    2 Minute Post HR 90 103    Interval Heart Rate? Yes Yes         Interval Oxygen    Interval Oxygen? Yes Yes    Baseline Oxygen Saturation % 98 % 100 %    1 Minute Oxygen Saturation % 95 % 93 %    1 Minute Liters of Oxygen 0 L 0 L    2 Minute Oxygen Saturation % 95 % 92 %    2 Minute Liters of Oxygen 0 L 0 L    3 Minute Oxygen Saturation % 95 % 91 %    3 Minute Liters of Oxygen 0 L 0 L    4 Minute Oxygen Saturation % 93 % 92 %    4 Minute Liters of Oxygen 0 L 0 L    5 Minute Oxygen Saturation % 90 % 91 %    5 Minute Liters of Oxygen 0 L 0 L    6 Minute Oxygen Saturation % 86 % 92 %    6 Minute Liters of Oxygen 0 L 0 L    2 Minute Post Oxygen Saturation % 92 % 96 %    2 Minute Post Liters of Oxygen 0 L 0 L           Psychological, QOL, Others - Outcomes: PHQ 2/9: Depression screen [facility] 2/9 01/27/2021 11/22/2020  Decreased Interest 0 0  Down, Depressed, Hopeless 0 0  PHQ - 2 Score 0 0  Altered sleeping 0 0  Tired, decreased energy 0 2  Change in appetite 0 0  Feeling bad or failure about yourself  0 0  Trouble concentrating 0 0  Moving slowly or fidgety/restless 0 0  Suicidal thoughts 0 0  PHQ-9 Score 0 -  Difficult doing work/chores Not difficult at all Not difficult at all    Quality of Life:   Personal Goals: Goals established at orientation with interventions provided to work toward goal.  Personal Goals and Risk Factors at Admission - 11/22/20 1003      Core Components/Risk Factors/Patient Goals on Admission   Improve shortness of breath with ADL's Yes    Intervention Provide education, individualized exercise plan and daily activity instruction to help decrease symptoms of SOB with activities of daily living.    Expected Outcomes Short Term: Improve cardiorespiratory fitness to achieve a reduction of symptoms when performing ADLs;Long Term: Be able to perform more ADLs without symptoms or delay the onset of symptoms            Personal Goals Discharge:  Goals and Risk Factor Review    Row Name 11/22/20 1003 12/13/20 1345 01/10/21 1337          Core Components/Risk Factors/Patient Goals Review   Personal Goals Review Develop more efficient breathing techniques such as purse lipped breathing and diaphragmatic breathing and practicing self-pacing with activity.;Increase knowledge of respiratory medications and ability to use respiratory devices properly.;Improve shortness of breath with ADL's Develop more efficient breathing techniques such as purse lipped breathing and diaphragmatic breathing and practicing self-pacing with activity.;Increase knowledge of respiratory medications and ability to use respiratory devices properly.;Improve shortness of breath with ADL's Develop more efficient breathing techniques such as purse lipped breathing and diaphragmatic breathing and practicing self-pacing with activity.;Increase knowledge of respiratory medications and ability to use respiratory devices properly.;Improve shortness of breath with ADL's     Review -- Has attended 2 exercise sessions, too early to have met any program goals. Thai has been absent many times, he states he feels stronger performing activites at home and has started exercising at home on the days he is not in pulmonary rehab.  He is exercising @ 2.5 mets on the nustep and walking 16 laps on the track in 15 minutes.     Expected Outcomes -- See admission goals. See admission goals.            Exercise Goals and Review:  Exercise Goals    Row Name 11/22/20 1037             Exercise Goals   Increase Physical Activity Yes       Intervention Provide advice, education, support and counseling about physical activity/exercise needs.;Develop an individualized exercise prescription for aerobic and resistive training based on initial evaluation findings, risk stratification, comorbidities and participant's personal goals.       Expected Outcomes Short Term: Attend rehab on a regular basis to increase amount of physical activity.;Long Term: Add in home exercise to make exercise  part of routine and to increase amount of physical activity.;Long Term: Exercising regularly at least 3-5 days a week.       Increase Strength and Stamina Yes       Intervention Provide advice, education, support and counseling about physical activity/exercise needs.;Develop an individualized exercise prescription for aerobic and resistive training based on initial evaluation findings, risk stratification, comorbidities and participant's personal goals.       Expected Outcomes Short Term: Increase workloads from initial exercise prescription for resistance, speed, and METs.;Short Term: Perform resistance training exercises routinely during rehab and add in resistance training at home;Long Term: Improve cardiorespiratory fitness, muscular endurance and strength as measured by increased METs and functional capacity (6MWT)       Able to understand and  use rate of perceived exertion (RPE) scale Yes       Intervention Provide education and explanation on how to use RPE scale       Expected Outcomes Short Term: Able to use RPE daily in rehab to express subjective intensity level;Long Term:  Able to use RPE to guide intensity level when exercising independently       Able to understand and use Dyspnea scale Yes       Intervention Provide education and explanation on how to use Dyspnea scale       Expected Outcomes Short Term: Able to use Dyspnea scale daily in rehab to express subjective sense of shortness of breath during exertion;Long Term: Able to use Dyspnea scale to guide intensity level when exercising independently       Knowledge and understanding of Target Heart Rate Range (THRR) Yes       Intervention Provide education and explanation of THRR including how the numbers were predicted and where they are located for reference       Expected Outcomes Short Term: Able to state/look up THRR;Long Term: Able to use THRR to govern intensity when exercising independently;Short Term: Able to use daily as  guideline for intensity in rehab       Understanding of Exercise Prescription Yes       Intervention Provide education, explanation, and written materials on patient's individual exercise prescription       Expected Outcomes Short Term: Able to explain program exercise prescription;Long Term: Able to explain home exercise prescription to exercise independently              Exercise Goals Re-Evaluation:  Exercise Goals Re-Evaluation    Row Name 12/13/20 1628 01/11/21 1307           Exercise Goal Re-Evaluation   Exercise Goals Review Increase Physical Activity;Increase Strength and Stamina;Able to understand and use rate of perceived exertion (RPE) scale;Able to understand and use Dyspnea scale;Knowledge and understanding of Target Heart Rate Range (THRR);Understanding of Exercise Prescription Increase Physical Activity;Increase Strength and Stamina;Able to understand and use rate of perceived exertion (RPE) scale;Able to understand and use Dyspnea scale;Knowledge and understanding of Target Heart Rate Range (THRR);Understanding of Exercise Prescription      Comments Patient has completed 2 exercise sessions and has tolerated well so far. It is too early to see any progression, but we will continue to monitor and progress as he is able. He is beginning to be independent with the equipment and resistance bands. He is able to do all exercises with no complaints. He is exercising at 1.9 METS on the Nustep and 2.05 METS walking the track. Jihaad has completed 6 exercise sessions and has been a little slow to make progressions with workload and MET increases, most likely due to irregular attendance. He has only completed 6 exercise sessions in 6 weeks. Pt is exercising at 2.5 METS on the Nustep and 2.51 METS walking the track. He is independent with all resistance training exercises and stretches. Will continue to monitor and progress as he is able.      Expected Outcomes Through exercise at rehab and  home the patient will decrease shortness of breath with daily activities and feel confident in carrying out an exercise regimn at home. Through exercise at rehab and home the patient will decrease shortness of breath with daily activities and feel confident in carrying out an exercise regimn at home.  Nutrition & Weight - Outcomes:  Pre Biometrics - 11/22/20 0956      Pre Biometrics   Height 6' 2"  (1.88 m)    Weight 92.4 kg    BMI (Calculated) 26.14    Grip Strength 45.5 kg           Post Biometrics - 01/27/21 1532       Post  Biometrics   Grip Strength 40 kg           Nutrition:  Nutrition Therapy & Goals - 01/06/21 1437      Nutrition Therapy   Diet Carb modified      Personal Nutrition Goals   Nutrition Goal CBG concentrations in the normal range or as close to normal as is safely possible.    Personal Goal #2 Improved blood glucose control as evidenced by pt's A1c trending from 10.9 toward less than 7.0.      Intervention Plan   Intervention Prescribe, educate and counsel regarding individualized specific dietary modifications aiming towards targeted core components such as weight, hypertension, lipid management, diabetes, heart failure and other comorbidities.    Expected Outcomes Short Term Goal: Understand basic principles of dietary content, such as calories, fat, sodium, cholesterol and nutrients.           Nutrition Discharge:   Education Questionnaire Score:  Knowledge Questionnaire Score - 02/12/21 1248      Knowledge Questionnaire Score   Pre Score 14/18    Post Score 17/18           Goals reviewed with patient; copy given to patient.

## 2021-02-21 NOTE — Addendum Note (Signed)
Encounter addended by: Drema Pry, RN on: 02/21/2021 2:20 PM  Actions taken: Clinical Note Signed, Episode resolved

## 2021-09-11 IMAGING — MR MR ABDOMEN WO/W CM
18 of 19 series · 46 of 48 positions shown · IV contrast (gadavist)
Comparison: None.

CLINICAL DATA: Further characterization abnormal findings on prior
CT, possible hepatic sarcoidosis.

EXAM:
MRI ABDOMEN WITHOUT AND WITH CONTRAST
TECHNIQUE: Multiplanar multisequence MR imaging of the abdomen was performed
both before and after the administration of intravenous contrast.
CONTRAST:  9mL GADAVIST GADOBUTROL 1 MMOL/ML IV SOLN

[Series 3: T2 · coronal · 6.0mm · 1.56mm/px · 3 of 42 slices shown (1 of 2)]
[im 1/42]
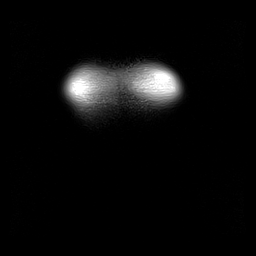
[im 21/42]
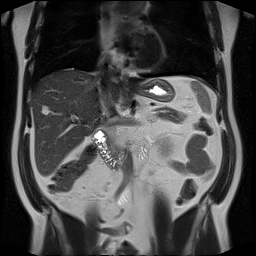
[im 42/42]
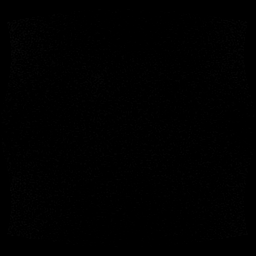

[Series 5: T2 fat-sat · axial · 6.0mm · 1.30mm/px · 1 of 36 slices shown]
[im 1/36]
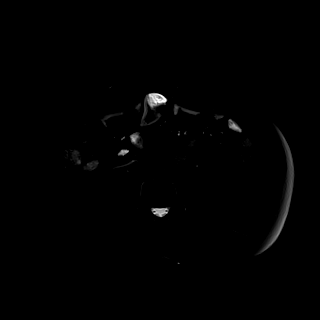

[Series 6: DWI · axial · 6.0mm · 1.49mm/px · z∈[-180,+72]mm · 3 of 72 slices shown (1 of 2)]
[im 1/72]
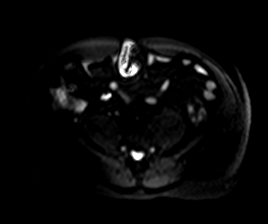
[im 36/72]
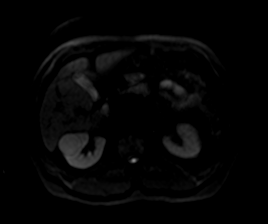
[im 72/72]
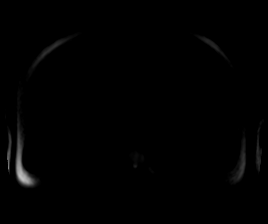

[Series 7: DWI · axial · 6.0mm · 1.49mm/px · 1 of 36 slices shown (2 of 2)]
[im 1/36]
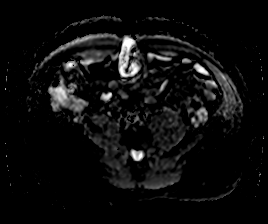

[Series 8: T1 · axial · 3.0mm · 1.25mm/px · z∈[-195,+66]mm · 3 of 88 slices shown (1 of 2)]
[im 1/88]
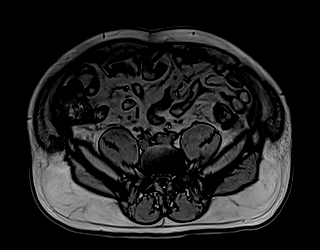
[im 44/88]
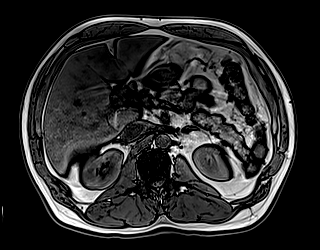
[im 88/88]
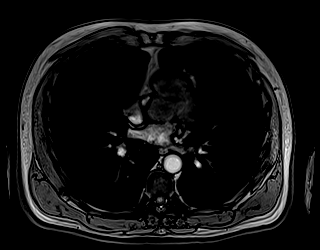

[Series 9: T1 · axial · 3.0mm · 1.25mm/px · z∈[-195,+66]mm · 3 of 88 slices shown (2 of 2)]
[im 1/88]
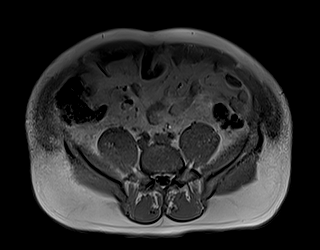
[im 44/88]
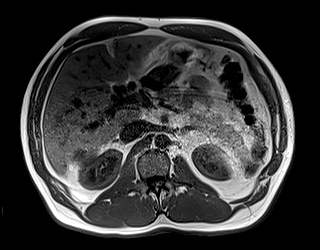
[im 88/88]
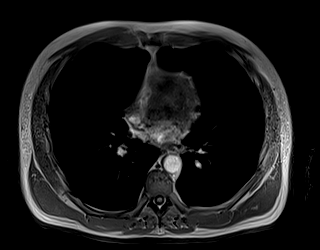

[Series 10: bSSFP · axial · 4.0mm · 0.84mm/px · z∈[-184,+52]mm · 2 of 60 slices shown]
[im 1/60]
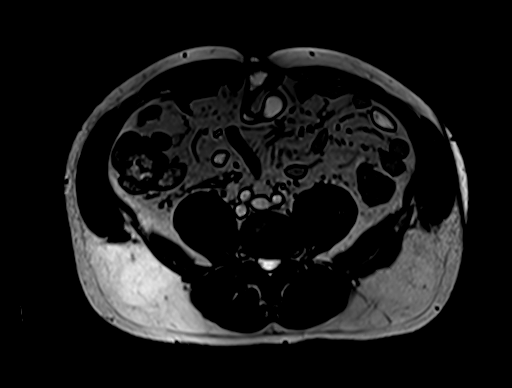
[im 60/60]
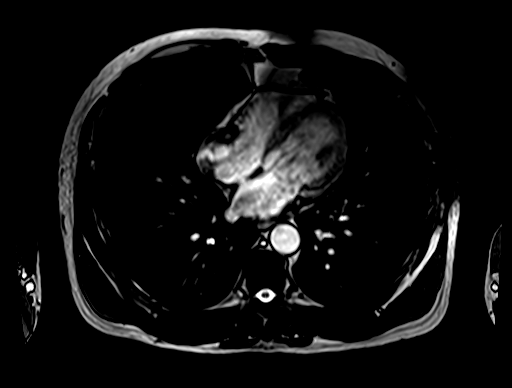

[Series 12: T1 dynamic · axial · 3.0mm · 1.25mm/px · z∈[-200,+61]mm · 3 of 88 slices shown (1 of 6)]
[im 1/88]
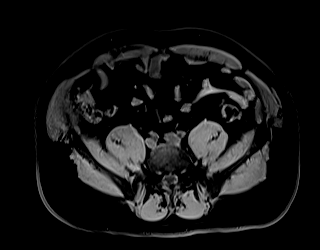
[im 44/88]
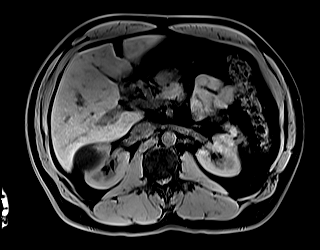
[im 88/88]
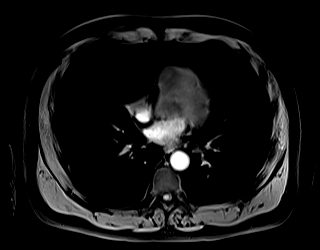

[Series 15: T1 dynamic · axial · 3.0mm · 1.25mm/px · z∈[-200,+61]mm · 3 of 88 slices shown (2 of 6)]
[im 1/88]
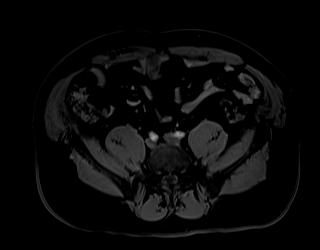
[im 44/88]
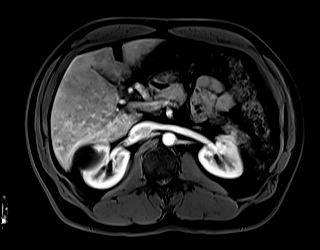
[im 88/88]
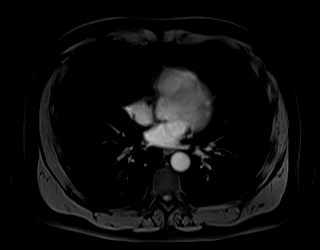

[Series 17: T1 dynamic · axial · 3.0mm · 1.25mm/px · z∈[-200,+61]mm · 3 of 88 slices shown (3 of 6)]
[im 1/88]
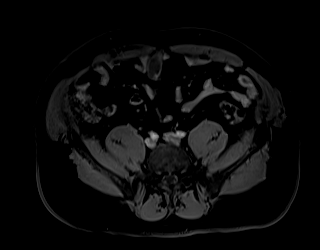
[im 44/88]
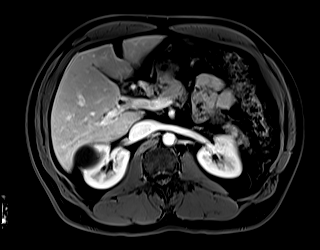
[im 88/88]
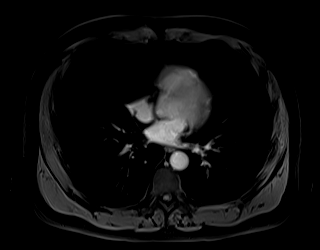

[Series 19: T1 dynamic · axial · 3.0mm · 1.25mm/px · z∈[-200,+61]mm · 3 of 88 slices shown (4 of 6)]
[im 1/88]
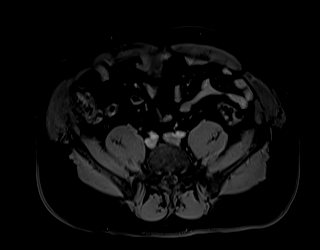
[im 44/88]
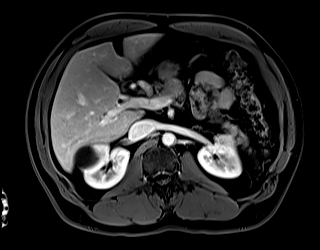
[im 88/88]
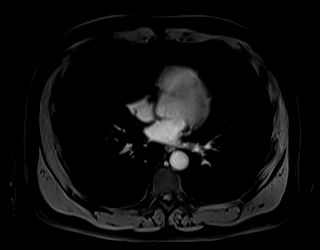

[Series 21: T1 dynamic · coronal · 5.0mm · 1.30mm/px · 2 of 60 slices shown (5 of 6)]
[im 1/60]
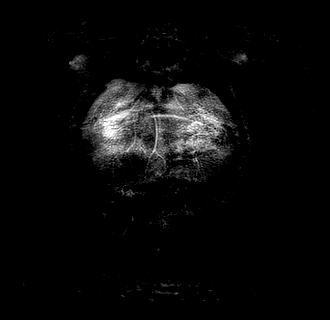
[im 60/60]
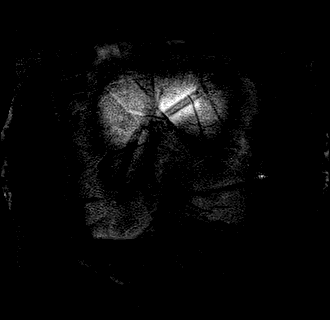

[Series 22: T2 · axial · 6.0mm · 1.56mm/px · 1 of 38 slices shown (2 of 2)]
[im 1/38]
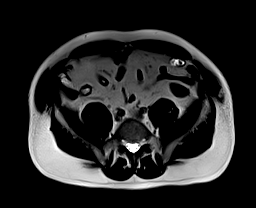

[Series 24: T1 dynamic · axial · 3.0mm · 1.25mm/px · z∈[-200,+61]mm · 3 of 88 slices shown (6 of 6)]
[im 1/88]
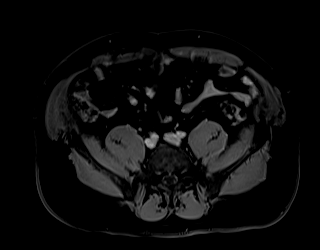
[im 44/88]
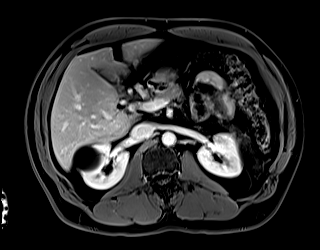
[im 88/88]
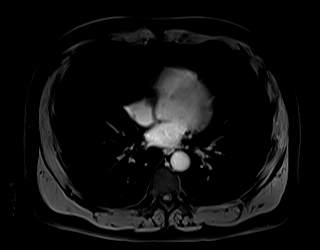

[Series 100: sub_20 sec · axial · 3.0mm · 1.25mm/px · z∈[-200,+61]mm · 3 of 88 slices shown]
[im 1/88]
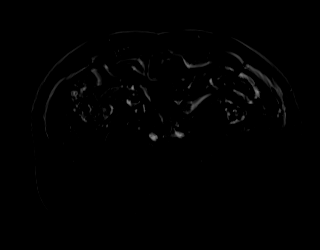
[im 44/88]
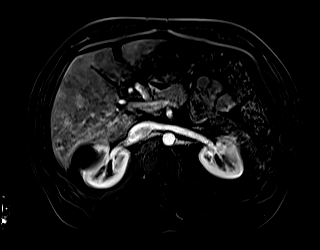
[im 88/88]
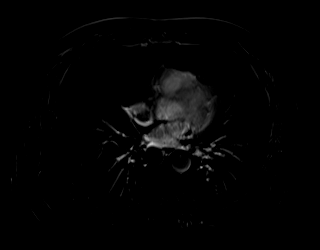

[Series 101: sub_45 sec · axial · 3.0mm · 1.25mm/px · z∈[-200,+61]mm · 3 of 88 slices shown]
[im 1/88]
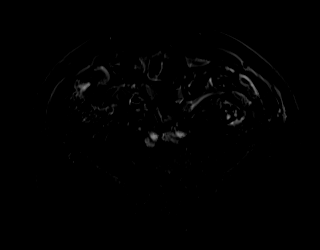
[im 44/88]
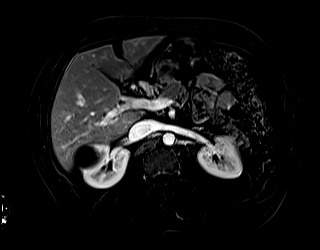
[im 88/88]
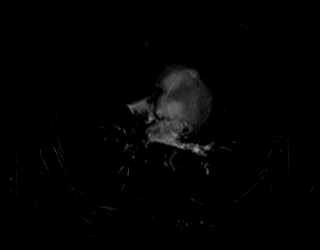

[Series 102: sub_90 sec · axial · 3.0mm · 1.25mm/px · z∈[-200,+61]mm · 3 of 88 slices shown]
[im 1/88]
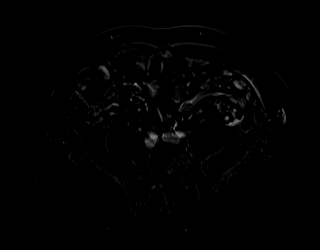
[im 44/88]
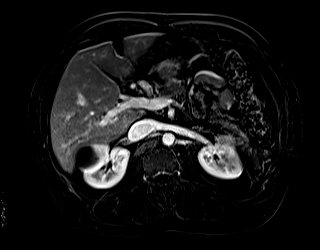
[im 88/88]
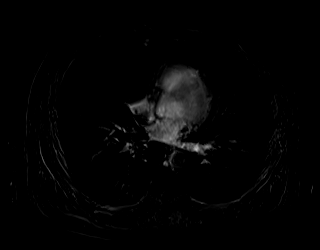

[Series 103: sub_delay · axial · 3.0mm · 1.25mm/px · z∈[-200,+61]mm · 3 of 88 slices shown]
[im 1/88]
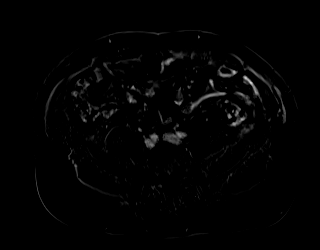
[im 44/88]
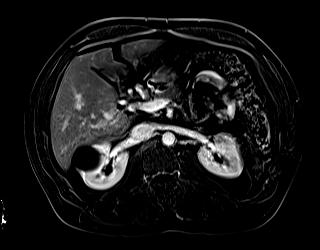
[im 88/88]
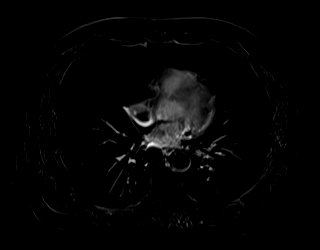

[46 of 48 positions shown; findings below may reference images not displayed]

FINDINGS: Lower chest: No acute abnormality.

Hepatobiliary: Bilobar T2 hyperintense T1 hypointense hepatic
lesions, proximally 5, which demonstrate peripheral nodular
discontinuous enhancement on arterial phase imaging with intensity
which follows blood pool and which fills in on delayed imaging. For
instance there is a 1.5 cm segment IV a lesion on image [DATE] and a
2.9 cm segment VI/7 lesion on image [DATE]. No hepatic lesions
visualized which are intrinsically hypointense to liver on all
nonenhanced sequences, to suggest hepatic sarcoidosis. Gallbladder
is unremarkable. No biliary ductal dilation.

Pancreas:  Unremarkable.

Spleen:  No splenomegaly.  No discrete splenic lesions.

Adrenals/Urinary Tract: Bilateral adrenal glands are unremarkable.
4.2 cm right renal cyst. No suspicious renal lesions.

Stomach/Bowel: Visualized portions within the abdomen are
unremarkable.

Vascular/Lymphatic: No pathologically enlarged lymph nodes
identified. No abdominal aortic aneurysm demonstrated.

Other:  None.

Musculoskeletal: No suspicious bone lesions identified.
IMPRESSION: 1. Bilobar hepatic lesions consistent with benign hepatic
hemangiomas. No suspicious hepatic lesions and no hepatic lesions
with imaging characteristics suggestive of hepatic sarcoidosis.
2. No splenomegaly
